# Patient Record
Sex: Female | Born: 1980 | Race: White | Hispanic: No | Marital: Married | Smoking: Former smoker
Health system: Southern US, Community
[De-identification: ages and names within clinical notes are randomized; demographics above are authoritative.]

## PROBLEM LIST (undated history)

## (undated) DIAGNOSIS — L309 Dermatitis, unspecified: Secondary | ICD-10-CM

## (undated) DIAGNOSIS — R7989 Other specified abnormal findings of blood chemistry: Secondary | ICD-10-CM

## (undated) DIAGNOSIS — F419 Anxiety disorder, unspecified: Secondary | ICD-10-CM

## (undated) DIAGNOSIS — F329 Major depressive disorder, single episode, unspecified: Secondary | ICD-10-CM

## (undated) DIAGNOSIS — T7840XA Allergy, unspecified, initial encounter: Secondary | ICD-10-CM

## (undated) DIAGNOSIS — F32A Depression, unspecified: Secondary | ICD-10-CM

## (undated) DIAGNOSIS — R609 Edema, unspecified: Secondary | ICD-10-CM

## (undated) DIAGNOSIS — Z5189 Encounter for other specified aftercare: Secondary | ICD-10-CM

## (undated) DIAGNOSIS — M1990 Unspecified osteoarthritis, unspecified site: Secondary | ICD-10-CM

## (undated) HISTORY — DX: Depression, unspecified: F32.A

## (undated) HISTORY — DX: Anxiety disorder, unspecified: F41.9

## (undated) HISTORY — DX: Edema, unspecified: R60.9

## (undated) HISTORY — DX: Allergy, unspecified, initial encounter: T78.40XA

## (undated) HISTORY — DX: Other specified abnormal findings of blood chemistry: R79.89

## (undated) HISTORY — PX: FOOT SURGERY: SHX648

## (undated) HISTORY — DX: Encounter for other specified aftercare: Z51.89

## (undated) HISTORY — DX: Unspecified osteoarthritis, unspecified site: M19.90

## (undated) HISTORY — DX: Dermatitis, unspecified: L30.9

## (undated) HISTORY — PX: CHOLECYSTECTOMY: SHX55

---
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
## 2005-07-31 ENCOUNTER — Emergency Department: Payer: Self-pay | Admitting: Emergency Medicine

## 2005-08-01 ENCOUNTER — Inpatient Hospital Stay: Payer: Self-pay | Admitting: General Surgery

## 2005-08-05 ENCOUNTER — Inpatient Hospital Stay: Payer: Self-pay | Admitting: General Surgery

## 2006-01-16 ENCOUNTER — Emergency Department: Payer: Self-pay | Admitting: Emergency Medicine

## 2006-07-09 ENCOUNTER — Emergency Department: Payer: Self-pay | Admitting: Emergency Medicine

## 2006-08-25 ENCOUNTER — Emergency Department: Payer: Self-pay | Admitting: Unknown Physician Specialty

## 2006-09-21 ENCOUNTER — Ambulatory Visit: Payer: Self-pay | Admitting: Emergency Medicine

## 2006-11-01 ENCOUNTER — Ambulatory Visit: Payer: Self-pay | Admitting: Internal Medicine

## 2006-11-27 ENCOUNTER — Emergency Department: Payer: Self-pay | Admitting: Emergency Medicine

## 2007-01-01 ENCOUNTER — Emergency Department: Payer: Self-pay | Admitting: Emergency Medicine

## 2007-01-01 ENCOUNTER — Other Ambulatory Visit: Payer: Self-pay

## 2007-05-06 ENCOUNTER — Emergency Department: Payer: Self-pay | Admitting: Emergency Medicine

## 2007-06-12 ENCOUNTER — Ambulatory Visit: Payer: Self-pay | Admitting: Family Medicine

## 2007-09-10 IMAGING — US ABDOMEN ULTRASOUND
1 series · 17 of 25 positions shown · non-contrast
Comparison: none

REASON FOR EXAM: kidney stones rm 4
COMMENTS:

[Series 1: abdomen ultrasound · 17 of 57 slices shown]
[im 1/57]
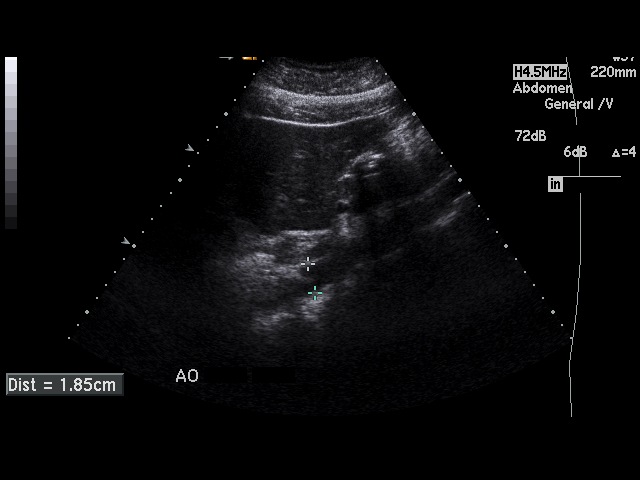
[im 5/57]
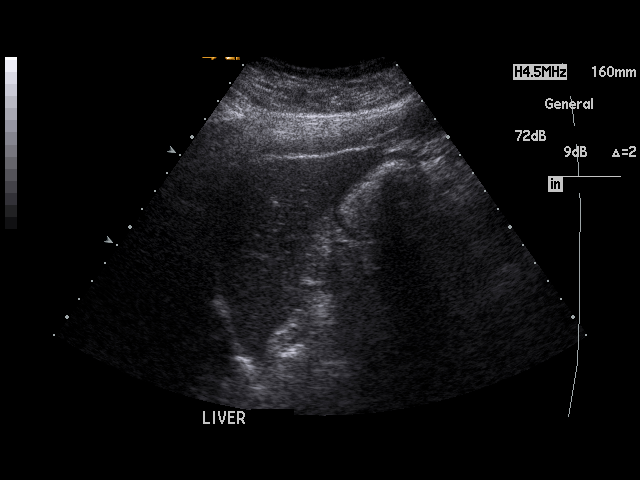
[im 8/57]
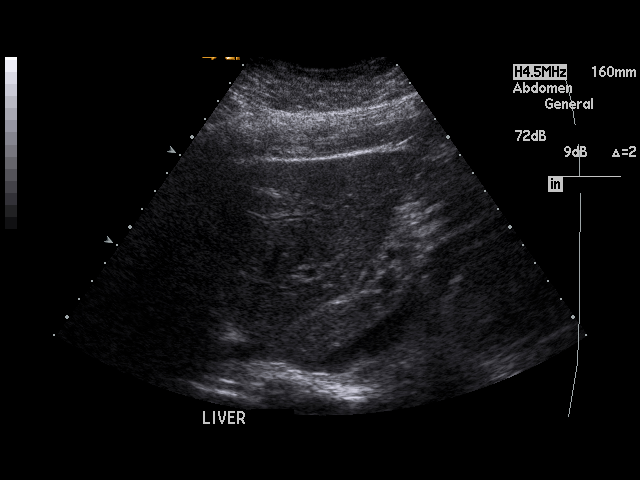
[im 12/57]
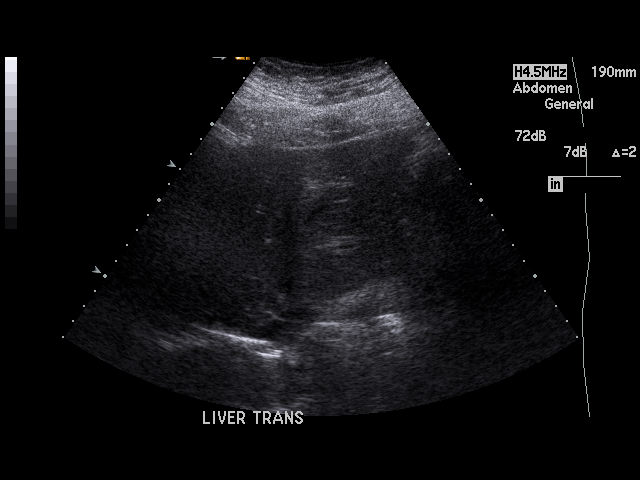
[im 15/57]
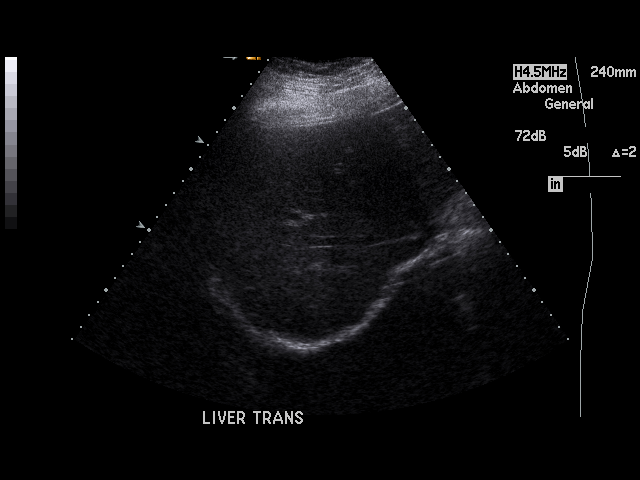
[im 19/57]
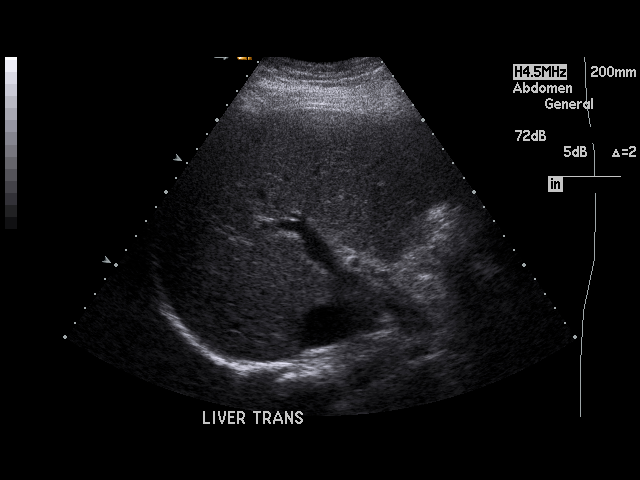
[im 22/57]
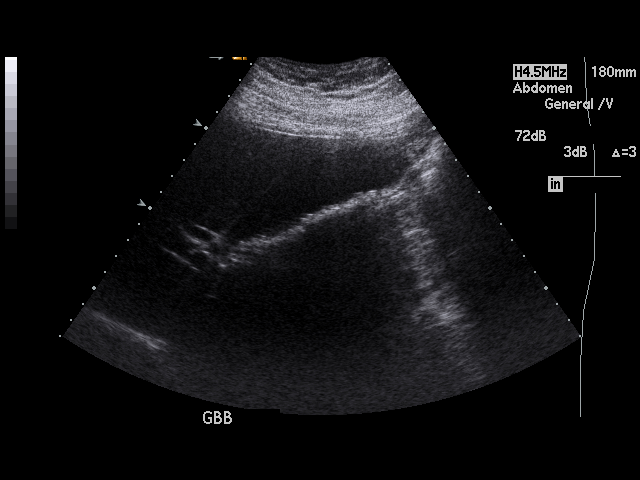
[im 26/57]
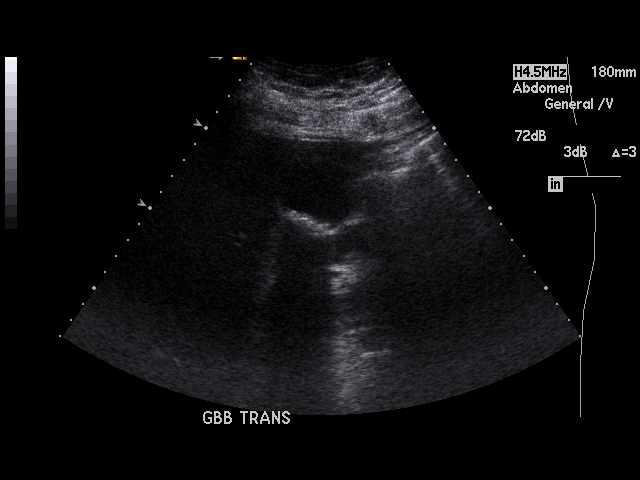
[im 29/57]
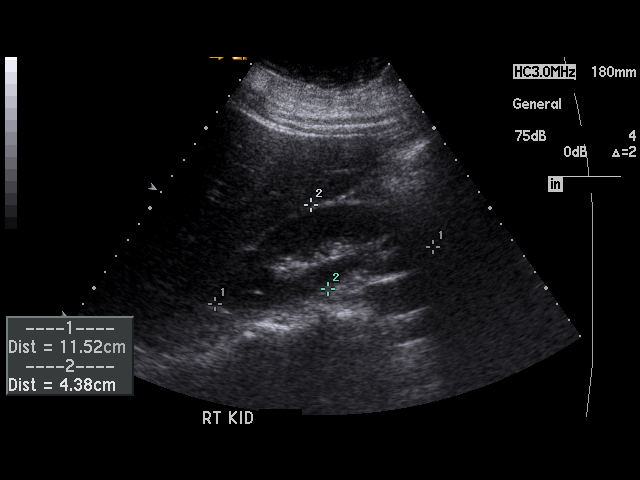
[im 31/57]
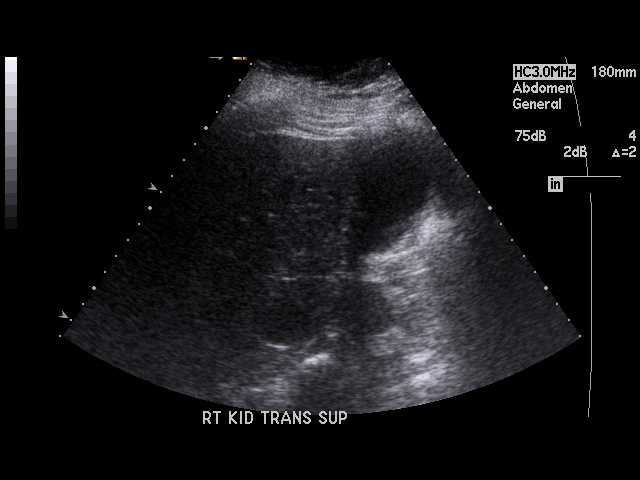
[im 36/57]
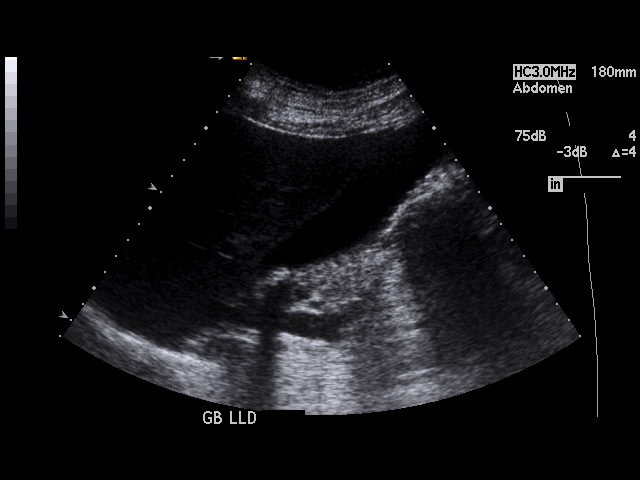
[im 38/57]
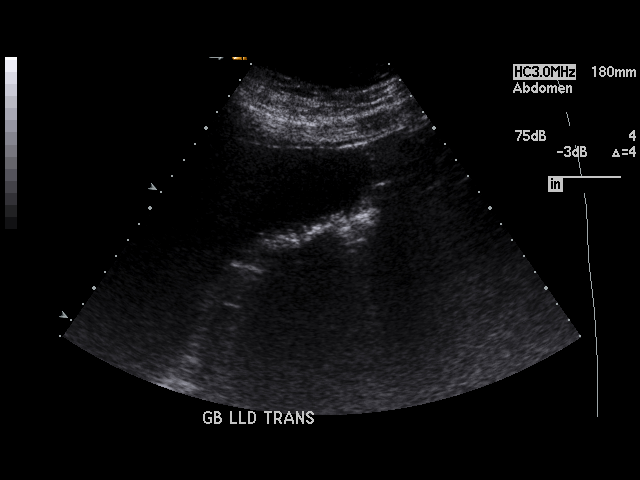
[im 43/57]
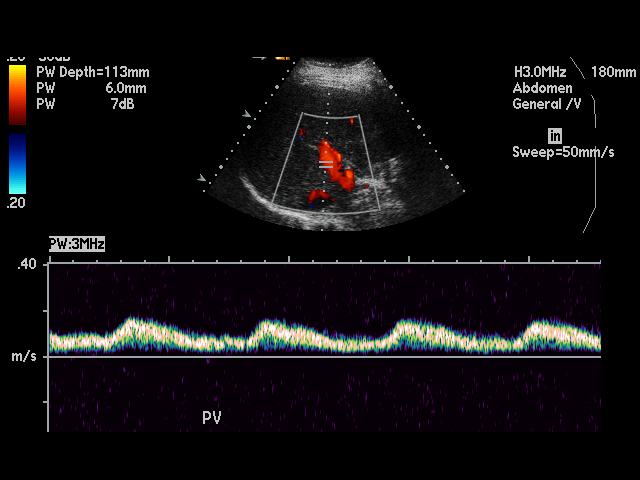
[im 45/57]
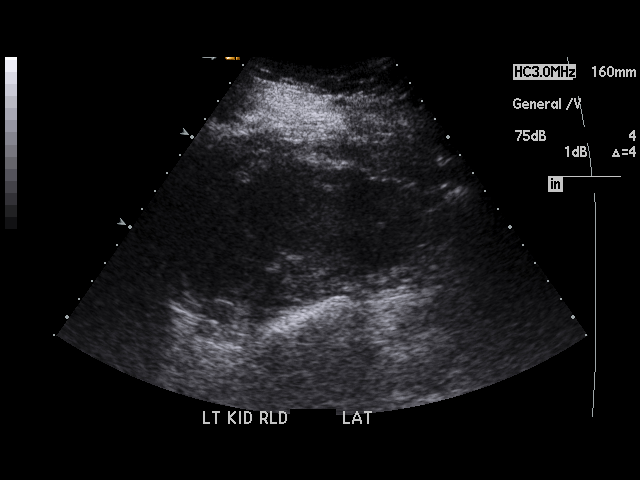
[im 50/57]
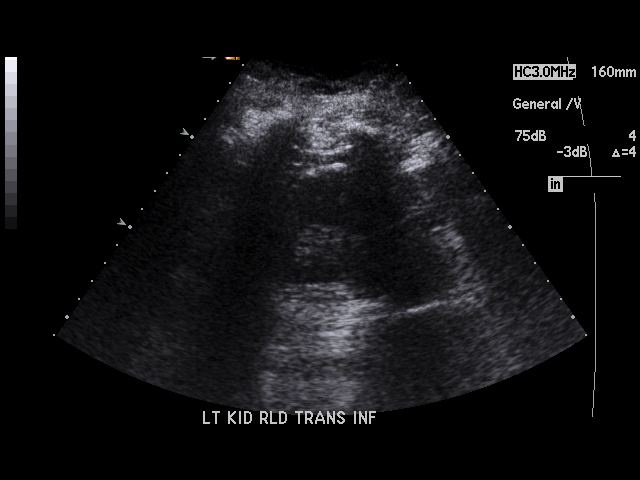
[im 52/57]
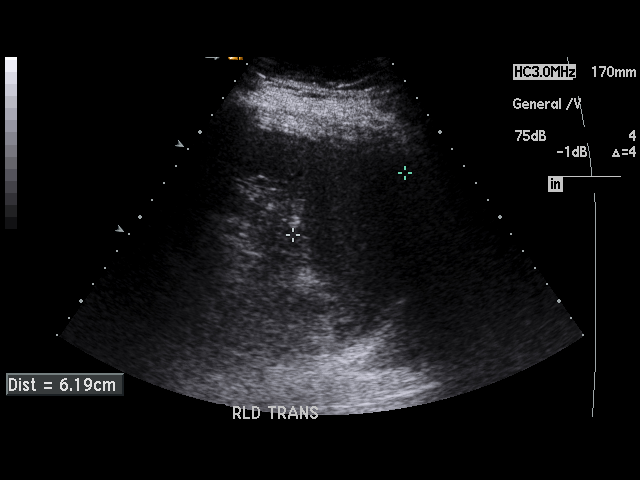
[im 57/57]
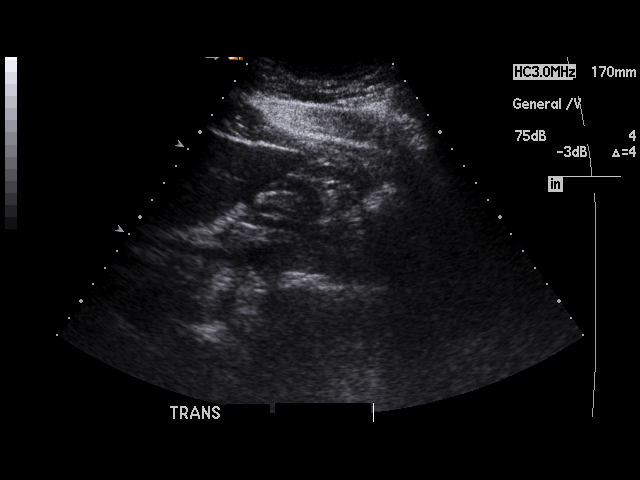

[17 of 25 positions shown; findings below may reference images not displayed]

PROCEDURE:     US  - US ABDOMEN GENERAL SURVEY  - August 01, 2005  [DATE]

RESULT:     The patient is complaining of abdominal discomfort and has a
history of urinary tract stones.

The kidneys demonstrate normal echotexture.  The RIGHT kidney measures
cm in length and the LEFT kidney measures 11.4 cm in length.  There is no
hydronephrosis.  I do not see objective evidence of calcified urinary tract
stones.

The liver exhibits normal echotexture with no evidence of mass or ductal
dilation. Normal portal venous flow toward the liver is seen. The
gallbladder is adequately distended and contains multiple echogenic layering
shadowing stones. There is no gallbladder wall thickening or pericholecystic
fluid.  There is no sonographic Murphy sign.  Some of the calcifications
appear to be non-mobile in the region of the gallbladder neck. The common
bile duct is normal at 4.9 mm in diameter.  The pancreas could not be
adequately evaluated due to bowel gas.  The spleen and abdominal aorta are
normal in appearance. There is no evidence of ascites.
IMPRESSION: 1)There are multiple tiny layering gallstones present.  I cannot exclude the
presence of some stones in the gallbladder neck, which are non-mobile.

2)I do not see evidence of urinary tract obstruction or of calcified kidney
stones.

3)I see no acute abnormality elsewhere within the abdomen.  The pancreas
could not be demonstrated.

A preliminary report was sent to the [HOSPITAL] the conclusion
of the study.

## 2007-09-13 ENCOUNTER — Emergency Department: Payer: Self-pay | Admitting: Emergency Medicine

## 2009-10-22 IMAGING — CR RIGHT ANKLE - COMPLETE 3+ VIEW
1 series · 5 of 5 positions shown · non-contrast
Comparison: none

REASON FOR EXAM: fall, injury, pain and swelling
COMMENTS:   LMP: depo

[Series 1: view not recorded · 0.17mm/px · 5 of 5 slices shown]
[im 1/5]
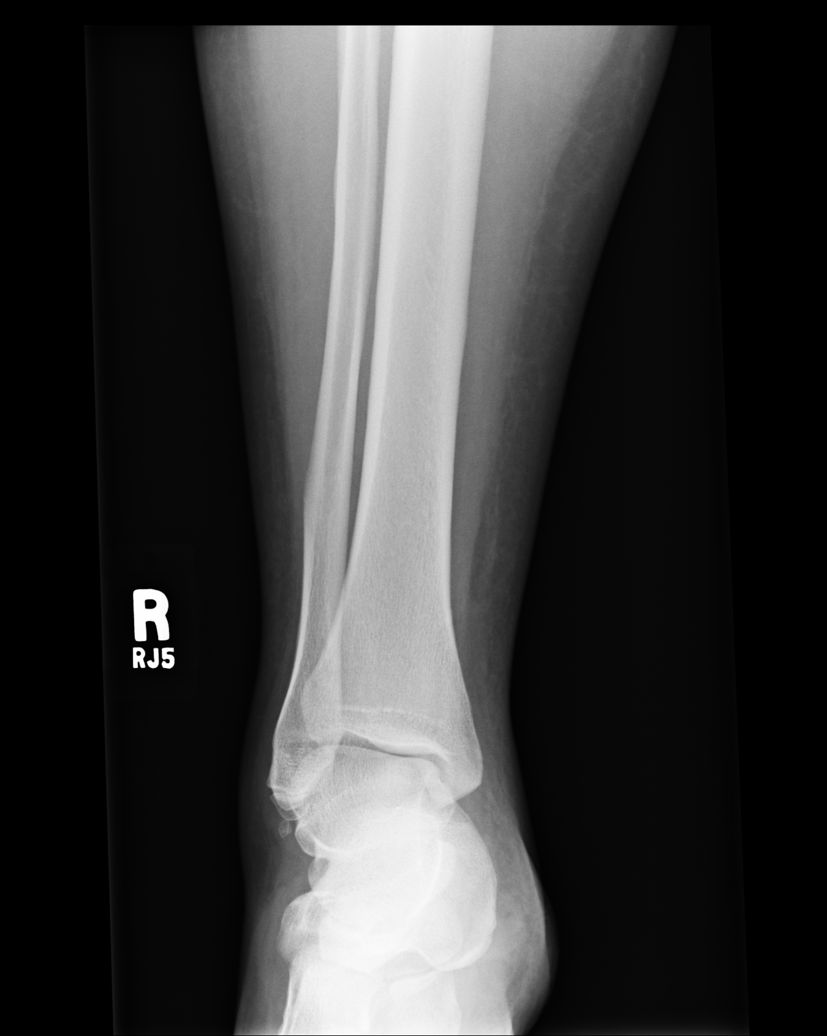
[im 2/5]
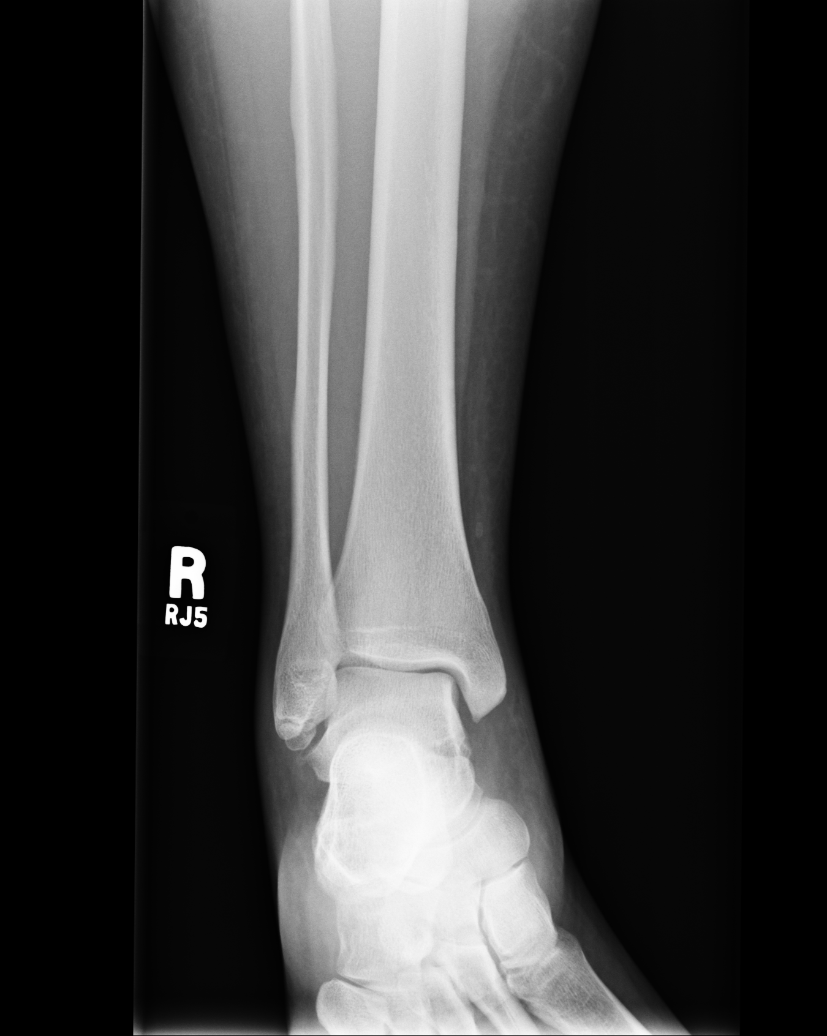
[im 3/5]
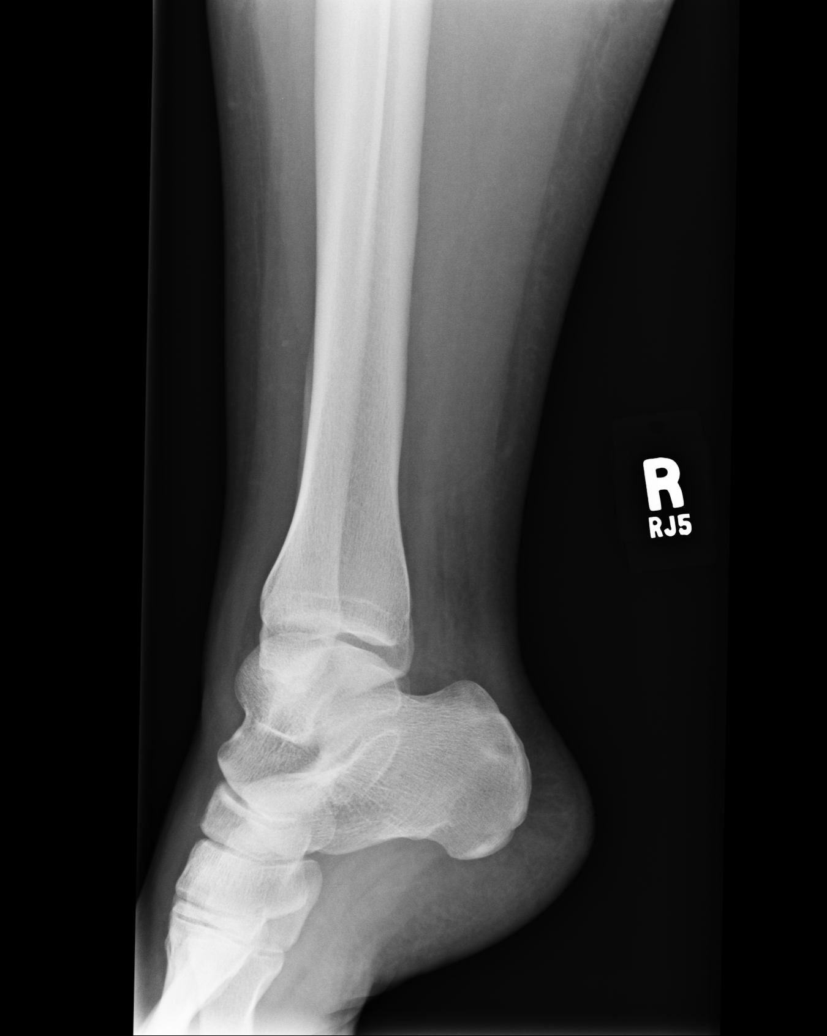
[im 4/5]
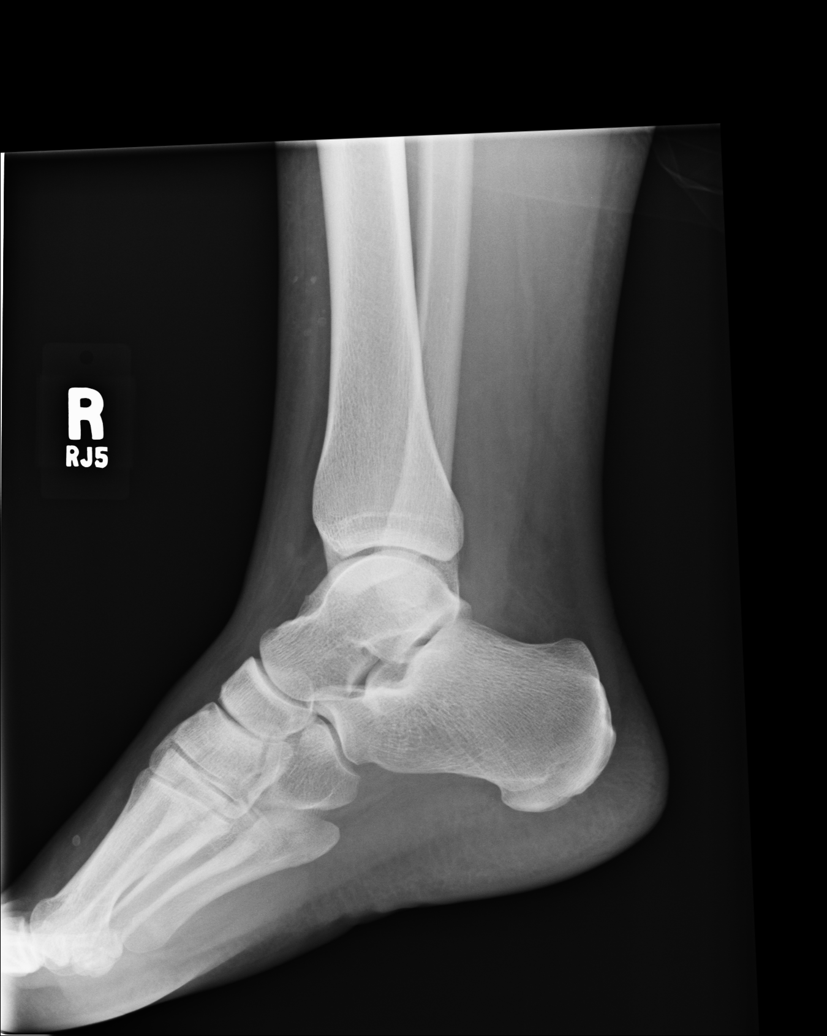
[im 5/5]
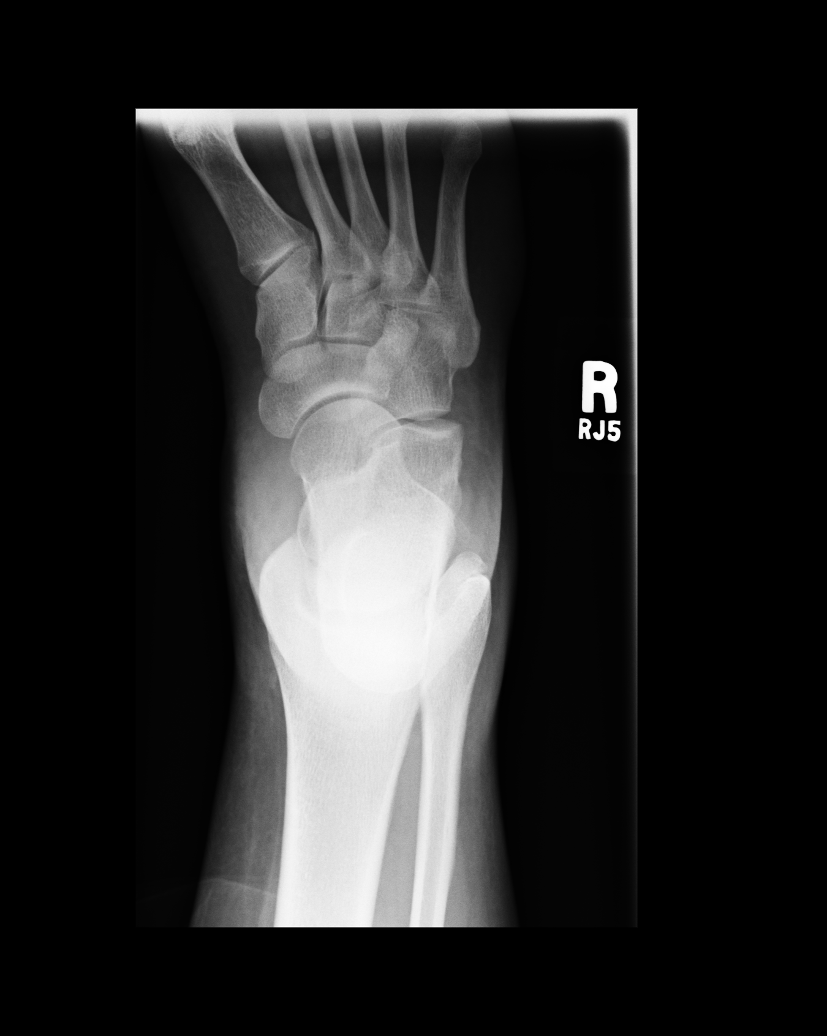

[5 of 5 positions shown; findings below may reference images not displayed]

PROCEDURE:     DXR - DXR ANKLE RIGHT COMPLETE  - September 13, 2007 [DATE]

RESULT:     The patient sustained a fracture through the distal fibula.
However, this does not appear entirely new. The margins of the fracture
fragments appear well corticated. I do not see more than mild soft tissue
swelling over the RIGHT ankle. The possibility of there being acute reinjury
of a previously fractured distal fibula is raised. The ankle joint mortise
is preserved. The talar dome is intact.
IMPRESSION: 1.     There is an abnormal appearance of the lateral malleolus. If there is
no history of prior fracture, then one must assume that there is an acute
fracture that is transversely oriented through the tip of the fibula. If
there is a history of prior fracture, the possibility of acute reinjury of a
previously fractured fibular tip is raised.
2.     I see no acute abnormality elsewhere involving the ankle.

## 2009-11-12 ENCOUNTER — Ambulatory Visit: Payer: Self-pay | Admitting: Internal Medicine

## 2009-11-27 ENCOUNTER — Ambulatory Visit: Payer: Self-pay | Admitting: Internal Medicine

## 2014-03-08 ENCOUNTER — Emergency Department: Payer: Self-pay | Admitting: Emergency Medicine

## 2016-02-09 ENCOUNTER — Encounter: Payer: Self-pay | Admitting: Emergency Medicine

## 2016-02-09 ENCOUNTER — Emergency Department
Admission: EM | Admit: 2016-02-09 | Discharge: 2016-02-09 | Disposition: A | Discharge status: Home / Self Care | Payer: Self-pay | Attending: Emergency Medicine | Admitting: Emergency Medicine

## 2016-02-09 DIAGNOSIS — S0501XA Injury of conjunctiva and corneal abrasion without foreign body, right eye, initial encounter: Secondary | ICD-10-CM | POA: Insufficient documentation

## 2016-02-09 DIAGNOSIS — X58XXXA Exposure to other specified factors, initial encounter: Secondary | ICD-10-CM | POA: Insufficient documentation

## 2016-02-09 DIAGNOSIS — Y939 Activity, unspecified: Secondary | ICD-10-CM | POA: Insufficient documentation

## 2016-02-09 DIAGNOSIS — Y929 Unspecified place or not applicable: Secondary | ICD-10-CM | POA: Insufficient documentation

## 2016-02-09 DIAGNOSIS — Y999 Unspecified external cause status: Secondary | ICD-10-CM | POA: Insufficient documentation

## 2016-02-09 DIAGNOSIS — H109 Unspecified conjunctivitis: Secondary | ICD-10-CM | POA: Insufficient documentation

## 2016-02-09 MED ORDER — TETRACAINE HCL 0.5 % OP SOLN
1.0000 [drp] | Freq: Once | OPHTHALMIC | Status: AC
  Administered 2016-02-09: 1 [drp] via OPHTHALMIC

## 2016-02-09 MED ORDER — FLUORESCEIN SODIUM 1 MG OP STRP
ORAL_STRIP | OPHTHALMIC | Status: AC
  Administered 2016-02-09: 1 via OPHTHALMIC
  Filled 2016-02-09: qty 1

## 2016-02-09 MED ORDER — FLUORESCEIN SODIUM 1 MG OP STRP
1.0000 | ORAL_STRIP | Freq: Once | OPHTHALMIC | Status: AC
  Administered 2016-02-09: 1 via OPHTHALMIC

## 2016-02-09 MED ORDER — ERYTHROMYCIN 5 MG/GM OP OINT
TOPICAL_OINTMENT | Freq: Once | OPHTHALMIC | Status: AC
  Administered 2016-02-09: 1 via OPHTHALMIC
  Filled 2016-02-09: qty 1

## 2016-02-09 MED ORDER — TETRACAINE HCL 0.5 % OP SOLN
OPHTHALMIC | Status: AC
  Administered 2016-02-09: 1 [drp] via OPHTHALMIC
  Filled 2016-02-09: qty 2

## 2016-02-09 MED ORDER — ERYTHROMYCIN 5 MG/GM OP OINT: TOPICAL_OINTMENT | Freq: Three times a day (TID) | OPHTHALMIC | 0 refills | Status: AC

## 2016-02-09 NOTE — ED Provider Notes (Signed)
[facility] Emergency Department Provider Note  Time seen: 7:25 AM  I have reviewed the triage vital signs and the nursing notes.   HISTORY  Chief Complaint Eye Problem    HPI Amy Larsen is a 35 y.o. female with no past medical history who presents the emergency department with a painful and red right eye. According to the patient she woke up this morning with drainage from her right eye, itching and tearing. States the eye hurts especially when she moves around. States it feels get dizzy when she scraped it several years ago. Denies any known trauma to the eye. Does not wear contacts. Denies any recent cough, congestion, fever, sinus congestion or nasal congestion.  No past medical history on file.  There are no active problems to display for this patient.   No past surgical history on file.  Prior to Admission medications   Not on File    Allergies  Allergen Reactions  . Codeine     No family history on file.  Social History Social History  Substance Use Topics  . Smoking status: Not on file  . Smokeless tobacco: Not on file  . Alcohol use Not on file    Review of Systems Constitutional: Negative for fever. Eyes:Right eye pain and redness, no visual changes ENT: Negative for congestion Cardiovascular: Negative for chest pain. Respiratory: Negative for shortness of breath. Gastrointestinal: Negative for abdominal pain Skin: Negative for rash. Neurological: Negative for headache 10-point ROS otherwise negative.  ____________________________________________   PHYSICAL EXAM:  VITAL SIGNS: ED Triage Vitals  Enc Vitals Group     BP 02/09/16 0630 135/83     Pulse Rate 02/09/16 0630 81     Resp 02/09/16 0630 18     Temp 02/09/16 0630 98.3 F (36.8 C)     Temp Source 02/09/16 0630 Oral     SpO2 02/09/16 0630 99 %     Weight 02/09/16 0629 (!) 345 lb (156.5 kg)     Height 02/09/16 0629 5\' 7"  (1.702 m)     Head Circumference --       Peak Flow --      Pain Score 02/09/16 0630 8     Pain Loc --      Pain Edu? --      Excl. in GC? --     Constitutional: Alert and oriented. Well appearing and in no distress. Eyes: Conjunctival injection of the right eye. PERRL, mild photophobia ENT   Head: Normocephalic and atraumatic   Mouth/Throat: Mucous membranes are moist. Cardiovascular: Normal rate, regular rhythm. No murmur Respiratory: Normal respiratory effort without tachypnea nor retractions. Breath sounds are clear Gastrointestinal: Soft and nontender. Musculoskeletal: Ambulates without issue Neurologic:  Normal speech and language.  Skin:  No rash Psychiatric: Mood and affect are normal.  ____________________________________________   INITIAL IMPRESSION / ASSESSMENT AND PLAN / ED COURSE  Pertinent labs & imaging results that were available during my care of the patient were reviewed by me and considered in my medical decision making (see chart for details).  The patient presents to the emergency department with right eye redness, tearing and discomfort. Woods lamp examination shows a corneal abrasion to the lower aspect of the right cornea. Patient does not use contacts. We will place the patient on erythromycin ointment and have her follow-up with a PCP. I discussed return precautions.  ____________________________________________   FINAL CLINICAL IMPRESSION(S) / ED DIAGNOSES  conjunctivitis    Minna AntisKevin Omie Ferger, MD 02/09/16 [phone]

## 2016-02-09 NOTE — ED Triage Notes (Signed)
Patient ambulatory to triage with steady gait, without difficulty or distress noted; pt reports right eye redness & pain since yesterday

## 2018-10-10 ENCOUNTER — Encounter: Payer: Self-pay | Admitting: Physician Assistant

## 2018-10-10 ENCOUNTER — Other Ambulatory Visit: Payer: Self-pay

## 2018-10-10 ENCOUNTER — Ambulatory Visit (INDEPENDENT_AMBULATORY_CARE_PROVIDER_SITE_OTHER): Payer: BC Managed Care – PPO | Admitting: Physician Assistant

## 2018-10-10 VITALS — BP 116/75 | HR 90 | Temp 97.8°F | Resp 16 | Ht 67.0 in | Wt 382.0 lb

## 2018-10-10 DIAGNOSIS — Z13220 Encounter for screening for lipoid disorders: Secondary | ICD-10-CM

## 2018-10-10 DIAGNOSIS — E559 Vitamin D deficiency, unspecified: Secondary | ICD-10-CM

## 2018-10-10 DIAGNOSIS — E039 Hypothyroidism, unspecified: Secondary | ICD-10-CM | POA: Diagnosis not present

## 2018-10-10 DIAGNOSIS — G8929 Other chronic pain: Secondary | ICD-10-CM

## 2018-10-10 DIAGNOSIS — Z114 Encounter for screening for human immunodeficiency virus [HIV]: Secondary | ICD-10-CM | POA: Diagnosis not present

## 2018-10-10 DIAGNOSIS — Z130 Encounter for screening for diseases of the blood and blood-forming organs and certain disorders involving the immune mechanism: Secondary | ICD-10-CM

## 2018-10-10 DIAGNOSIS — M545 Low back pain, unspecified: Secondary | ICD-10-CM

## 2018-10-10 DIAGNOSIS — R600 Localized edema: Secondary | ICD-10-CM

## 2018-10-10 MED ORDER — ERGOCALCIFEROL 1.25 MG (50000 UT) PO CAPS: 50000.0000 [IU] | ORAL_CAPSULE | ORAL | 2 refills | Status: AC

## 2018-10-10 MED ORDER — GABAPENTIN 300 MG PO CAPS: 300.0000 mg | ORAL_CAPSULE | Freq: Three times a day (TID) | ORAL | 0 refills | Status: DC

## 2018-10-10 NOTE — Progress Notes (Signed)
Patient: Amy Larsen, Female    DOB: 1980-07-03, 38 y.o.   MRN: [id number] Visit Date: 10/11/2018  Today's Provider: Trinna Post, PA-C   Chief Complaint  Patient presents with  . New Patient (Initial Visit)   Subjective:     Annual physical exam Amy Larsen is a 38 y.o. female who presents today for health maintenance and complete physical. She feels well. She reports exercising 3 days a week, walking 30 mintues. She reports she is sleeping well.  [place], [place]. Currently unemployed. Vapes. Alcohol - rarely, no unprescribed drugs. She has two children.   Morbid Obesity: Had recently lost weight and reported gaining it back during [place].   Wt Readings from Last 3 Encounters:  10/10/18 (!) 382 lb (173.3 kg)  02/09/16 (!) 345 lb (156.5 kg)    Edema in Lower legs: Takes Lasix 40 mg daily. does not take potassium. Increased banana intake.  Eczema on face and arms: hydrocortisone ointment for face, prescribed from previous doctor. Steroid cream for arms - triamcinelone.   Hypothyroidism: 88 mcg levothyroxine daily, has been taking this for several years.  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 Also loses feeling in thigh and gets burning sensation across top and back. Pain constant. No MRI, previously worked up in [place]. No history of EMG. Xrays of hips - showed nothing. No low back xray. Stretching, increasing exercise, rest. Thrown from horse twice a decade ago and was injured on the side in question. Elliptical helped somewhat. Regular walking can make it worse. Trouble standing for 20-30 minutes. Previously on piroxoicam. Medical grade ibuprofen. Tramadol from previous. Has taken tylenol previously and doesn't work. Pain constantly at least at a 5. Pain spikes with cold or heat.   Anxiety and Depression: Went to therapist and was previously on prozac. Support group of five different friends.  Doesn't need medication or counseling right now.   PAP Smear: She cannot remember when she last had a PAP smear.   Nexplanon: currently in place  -----------------------------------------------------------------   Review of Systems  Constitutional: Negative.   HENT: Negative.   Eyes: Negative.   Respiratory: Negative.   Cardiovascular: Positive for leg swelling.  Gastrointestinal: Negative.   Endocrine: Negative.   Genitourinary: Negative.   Musculoskeletal: Positive for back pain and myalgias.  Skin: Positive for rash.  Allergic/Immunologic: Positive for environmental allergies.  Neurological: Negative.   Hematological: Negative.   Psychiatric/Behavioral: Negative.     Social History      She  reports that she has quit smoking. Her smoking use included e-cigarettes. She has never used smokeless tobacco. She reports current alcohol use.       Social History   Socioeconomic History  . Marital status: Married    Spouse name: Not on file  . Number of children: Not on file  . Years of education: Not on file  . Highest education level: Not on file  Occupational History  . Not on file  Social Needs  . Financial resource strain: Not on file  . Food insecurity    Worry: Not on file    Inability: Not on file  . Transportation needs    Medical: Not on file    Non-medical: Not on file  Tobacco Use  . Smoking status: Former Smoker    Types: E-cigarettes  . Smokeless tobacco: Never Used  Substance and Sexual Activity  .  Alcohol use: Yes  . Drug use: Not on file  . Sexual activity: Not on file  Lifestyle  . Physical activity    Days per week: Not on file    Minutes per session: Not on file  . Stress: Not on file  Relationships  . Social Musicianconnections    Talks on phone: Not on file    Gets together: Not on file    Attends religious service: Not on file    Active member of club or organization: Not on file    Attends meetings of clubs or organizations: Not on file     Relationship status: Not on file  Other Topics Concern  . Not on file  Social History Narrative  . Not on file    Past Medical History:  Diagnosis Date  . Allergy   . Anxiety   . Blood transfusion without reported diagnosis   . Depression   . Eczema   . Edema   . Low vitamin D level      Patient Active Problem List   Diagnosis Date Noted  . Morbid obesity (HCC) 10/11/2018  . Hypothyroidism 10/11/2018  . Chronic bilateral low back pain 10/11/2018  . Lower extremity edema 10/11/2018    Past Surgical History:  Procedure Laterality Date  . CHOLECYSTECTOMY      Family History        Family Status  Relation Name Status  . Mother  Alive  . Father  Alive  . MGM  Deceased  . MGF  Deceased        Her family history includes Anxiety disorder in her mother; Congenital heart disease in her maternal grandfather; Depression in her mother; Hypertension in her maternal grandmother; Insomnia in her mother; Lung cancer in her maternal grandmother.      Allergies  Allergen Reactions  . Codeine      Current Outpatient Medications:  .  ergocalciferol (VITAMIN D2) 1.25 MG (50000 UT) capsule, Take 1 capsule (50,000 Units total) by mouth once a week., Disp: 4 capsule, Rfl: 2 .  furosemide (LASIX) 40 MG tablet, Take 40 mg by mouth., Disp: , Rfl:  .  levothyroxine (SYNTHROID) 88 MCG tablet, Take 88 mcg by mouth daily before breakfast., Disp: , Rfl:  .  traMADol (ULTRAM) 50 MG tablet, Take 50 mg by mouth every 12 (twelve) hours as needed., Disp: , Rfl:  .  gabapentin (NEURONTIN) 300 MG capsule, Take 1 capsule (300 mg total) by mouth 3 (three) times daily., Disp: 90 capsule, Rfl: 0   Patient Care Team: Maryella ShiversPollak, Adriana M, PA-C as PCP - General (Physician Assistant)    Objective:    Vitals: BP 116/75 (BP Location: Left Arm, Patient Position: Sitting, Cuff Size: Large)   Pulse 90   Temp 97.8 F (36.6 C) (Oral)   Resp 16   Ht 5\' 7"  (1.702 m)   Wt (!) 382 lb (173.3 kg)   BMI 59.83  kg/m    Vitals:   10/10/18 1127  BP: 116/75  Pulse: 90  Resp: 16  Temp: 97.8 F (36.6 C)  TempSrc: Oral  Weight: (!) 382 lb (173.3 kg)  Height: 5\' 7"  (1.702 m)     Physical Exam Constitutional:      Appearance: Normal appearance.  Neck:     Thyroid: No thyromegaly.  Cardiovascular:     Rate and Rhythm: Normal rate and regular rhythm.     Heart sounds: Normal heart sounds.  Pulmonary:     Effort: Pulmonary  effort is normal.     Breath sounds: Normal breath sounds.  Skin:    General: Skin is warm and dry.  Neurological:     Mental Status: She is alert and oriented to person, place, and time. Mental status is at baseline.  Psychiatric:        Mood and Affect: Mood normal.        Behavior: Behavior normal.      Depression Screen PHQ 2/9 Scores 10/10/2018  PHQ - 2 Score 2  PHQ- 9 Score 5       Assessment & Plan:     Routine Health Maintenance and Physical Exam  Exercise Activities and Dietary recommendations Goals   None      There is no immunization history on file for this patient.  Health Maintenance  Topic Date Due  . HIV Screening  11/11/1995  . TETANUS/TDAP  11/11/1999  . PAP SMEAR-Modifier  11/10/2001  . INFLUENZA VACCINE  11/17/2018     Discussed health benefits of physical activity, and encouraged her to engage in regular exercise appropriate for her age and condition.   1. Hypothyroidism, unspecified type  Check labs as below.   - TSH - Comprehensive Metabolic Panel (CMET)  2. Encounter for screening for HIV  - HIV antibody (with reflex)  3. Screening cholesterol level  - Lipid Profile  4. Screening for deficiency anemia  - CBC with Differential  5. Morbid obesity (HCC)  Check A1C today.  - HgB A1c  6. Chronic bilateral low back pain, unspecified whether sciatica present  Will request records from previous PCP. She has had low back pain for several years mostly on her left size. She is using a cane for ambulation.  Severe obesity is likely a component of this. She may have herniated disc. She has never had a lumbar spine xray of MRI. She had hip xrays from her previous PCP which were negative. She was on tramadol PRN. Has tried NSAIDs and tylenol. Has not tried physical therapy. Request she try at least 5 conservative interventions and explore workup with ortho. We will try gabapentin. May also consider cymbalta and effexor. Recommend physical therapy. Follow up in one month for back pain and PAP.  The entirety of the information documented in the History of Present Illness, Review of Systems and Physical Exam were personally obtained by me. Portions of this information were initially documented by Rondel BatonSulibeya Dimas, CMA and reviewed by me for thoroughness and accuracy.     --------------------------------------------------------------------    Trey SailorsAdriana M Pollak, PA-C  [facility]

## 2018-10-10 NOTE — Patient Instructions (Signed)
Hypothyroidism  Hypothyroidism is when the thyroid gland does not make enough of certain hormones (it is underactive). The thyroid gland is a small gland located in the lower front part of the neck, just in front of the windpipe (trachea). This gland makes hormones that help control how the body uses food for energy (metabolism) as well as how the heart and brain function. These hormones also play a role in keeping your bones strong. When the thyroid is underactive, it produces too little of the hormones thyroxine (T4) and triiodothyronine (T3). What are the causes? This condition may be caused by:  Hashimoto's disease. This is a disease in which the body's disease-fighting system (immune system) attacks the thyroid gland. This is the most common cause.  Viral infections.  Pregnancy.  Certain medicines.  Birth defects.  Past radiation treatments to the head or neck for cancer.  Past treatment with radioactive iodine.  Past exposure to radiation in the environment.  Past surgical removal of part or all of the thyroid.  Problems with a gland in the center of the brain (pituitary gland).  Lack of enough iodine in the diet. What increases the risk? You are more likely to develop this condition if:  You are female.  You have a family history of thyroid conditions.  You use a medicine called lithium.  You take medicines that affect the immune system (immunosuppressants). What are the signs or symptoms? Symptoms of this condition include:  Feeling as though you have no energy (lethargy).  Not being able to tolerate cold.  Weight gain that is not explained by a change in diet or exercise habits.  Lack of appetite.  Dry skin.  Coarse hair.  Menstrual irregularity.  Slowing of thought processes.  Constipation.  Sadness or depression. How is this diagnosed? This condition may be diagnosed based on:  Your symptoms, your medical history, and a physical exam.  Blood  tests. You may also have imaging tests, such as an ultrasound or MRI. How is this treated? This condition is treated with medicine that replaces the thyroid hormones that your body does not make. After you begin treatment, it may take several weeks for symptoms to go away. Follow these instructions at home:  Take over-the-counter and prescription medicines only as told by your health care provider.  If you start taking any new medicines, tell your health care provider.  Keep all follow-up visits as told by your health care provider. This is important. ? As your condition improves, your dosage of thyroid hormone medicine may change. ? You will need to have blood tests regularly so that your health care provider can monitor your condition. Contact a health care provider if:  Your symptoms do not get better with treatment.  You are taking thyroid replacement medicine and you: ? Sweat a lot. ? Have tremors. ? Feel anxious. ? Lose weight rapidly. ? Cannot tolerate heat. ? Have emotional swings. ? Have diarrhea. ? Feel weak. Get help right away if you have:  Chest pain.  An irregular heartbeat.  A rapid heartbeat.  Difficulty breathing. Summary  Hypothyroidism is when the thyroid gland does not make enough of certain hormones (it is underactive).  When the thyroid is underactive, it produces too little of the hormones thyroxine (T4) and triiodothyronine (T3).  The most common cause is Hashimoto's disease, a disease in which the body's disease-fighting system (immune system) attacks the thyroid gland. The condition can also be caused by viral infections, medicine, pregnancy, or past   radiation treatment to the head or neck.  Symptoms may include weight gain, dry skin, constipation, feeling as though you do not have energy, and not being able to tolerate cold.  This condition is treated with medicine to replace the thyroid hormones that your body does not make. This information  is not intended to replace advice given to you by your health care provider. Make sure you discuss any questions you have with your health care provider. Document Released: 04/04/2005 Document Revised: 03/15/2017 Document Reviewed: 03/15/2017 Elsevier Interactive Patient Education  2019 [employer]  

## 2018-10-11 ENCOUNTER — Telehealth: Payer: Self-pay

## 2018-10-11 DIAGNOSIS — G8929 Other chronic pain: Secondary | ICD-10-CM | POA: Insufficient documentation

## 2018-10-11 DIAGNOSIS — E039 Hypothyroidism, unspecified: Secondary | ICD-10-CM | POA: Insufficient documentation

## 2018-10-11 DIAGNOSIS — R600 Localized edema: Secondary | ICD-10-CM | POA: Insufficient documentation

## 2018-10-11 LAB — CBC WITH DIFFERENTIAL/PLATELET
Basophils Absolute: 0.1 10*3/uL (ref 0.0–0.2)
Basos: 1 %
EOS (ABSOLUTE): 0.1 10*3/uL (ref 0.0–0.4)
Eos: 1 %
Hematocrit: 41 % (ref 34.0–46.6)
Hemoglobin: 13.4 g/dL (ref 11.1–15.9)
Immature Grans (Abs): 0.1 10*3/uL (ref 0.0–0.1)
Immature Granulocytes: 1 %
Lymphocytes Absolute: 2.3 10*3/uL (ref 0.7–3.1)
Lymphs: 22 %
MCH: 26.1 pg — ABNORMAL LOW (ref 26.6–33.0)
MCHC: 32.7 g/dL (ref 31.5–35.7)
MCV: 80 fL (ref 79–97)
Monocytes Absolute: 0.9 10*3/uL (ref 0.1–0.9)
Monocytes: 9 %
Neutrophils Absolute: 7.2 10*3/uL — ABNORMAL HIGH (ref 1.4–7.0)
Neutrophils: 66 %
Platelets: 412 10*3/uL (ref 150–450)
RBC: 5.13 x10E6/uL (ref 3.77–5.28)
RDW: 15.2 % (ref 11.7–15.4)
WBC: 10.6 10*3/uL (ref 3.4–10.8)

## 2018-10-11 LAB — TSH: TSH: 2.52 u[IU]/mL (ref 0.450–4.500)

## 2018-10-11 LAB — COMPREHENSIVE METABOLIC PANEL
ALT: 28 IU/L (ref 0–32)
AST: 19 IU/L (ref 0–40)
Albumin/Globulin Ratio: 1.4 (ref 1.2–2.2)
Albumin: 4 g/dL (ref 3.8–4.8)
Alkaline Phosphatase: 126 IU/L — ABNORMAL HIGH (ref 39–117)
BUN/Creatinine Ratio: 21 (ref 9–23)
BUN: 17 mg/dL (ref 6–20)
Bilirubin Total: 0.4 mg/dL (ref 0.0–1.2)
CO2: 21 mmol/L (ref 20–29)
Calcium: 9.2 mg/dL (ref 8.7–10.2)
Chloride: 106 mmol/L (ref 96–106)
Creatinine, Ser: 0.82 mg/dL (ref 0.57–1.00)
GFR calc Af Amer: 106 mL/min/{1.73_m2} (ref >59)
GFR calc non Af Amer: 92 mL/min/{1.73_m2} (ref >59)
Globulin, Total: 2.8 g/dL (ref 1.5–4.5)
Glucose: 86 mg/dL (ref 65–99)
Potassium: 4.5 mmol/L (ref 3.5–5.2)
Sodium: 141 mmol/L (ref 134–144)
Total Protein: 6.8 g/dL (ref 6.0–8.5)

## 2018-10-11 LAB — HEMOGLOBIN A1C
Est. average glucose Bld gHb Est-mCnc: 117 mg/dL
Hgb A1c MFr Bld: 5.7 % — ABNORMAL HIGH (ref 4.8–5.6)

## 2018-10-11 LAB — LIPID PANEL
Chol/HDL Ratio: 3.3 ratio (ref 0.0–4.4)
Cholesterol, Total: 162 mg/dL (ref 100–199)
HDL: 49 mg/dL (ref >39)
LDL Calculated: 100 mg/dL — ABNORMAL HIGH (ref 0–99)
Triglycerides: 63 mg/dL (ref 0–149)
VLDL Cholesterol Cal: 13 mg/dL (ref 5–40)

## 2018-10-11 LAB — HIV ANTIBODY (ROUTINE TESTING W REFLEX): HIV Screen 4th Generation wRfx: NONREACTIVE

## 2018-10-11 NOTE — Telephone Encounter (Signed)
Patient advised as below.  

## 2018-10-11 NOTE — Telephone Encounter (Signed)
-----   Message from Trinna Post, [place] sent at 10/11/2018  8:17 AM EDT ----- Her labwork shoes prediabetes. This would be treated by increasing weight loss and exercise. Other labwork normal.

## 2018-11-07 DIAGNOSIS — M1612 Unilateral primary osteoarthritis, left hip: Secondary | ICD-10-CM | POA: Diagnosis not present

## 2018-11-07 DIAGNOSIS — M25552 Pain in left hip: Secondary | ICD-10-CM | POA: Diagnosis not present

## 2018-11-07 DIAGNOSIS — M5416 Radiculopathy, lumbar region: Secondary | ICD-10-CM | POA: Diagnosis not present

## 2018-11-07 DIAGNOSIS — G8929 Other chronic pain: Secondary | ICD-10-CM | POA: Diagnosis not present

## 2018-11-07 DIAGNOSIS — M5442 Lumbago with sciatica, left side: Secondary | ICD-10-CM | POA: Diagnosis not present

## 2018-11-07 DIAGNOSIS — M5136 Other intervertebral disc degeneration, lumbar region: Secondary | ICD-10-CM | POA: Diagnosis not present

## 2018-11-07 DIAGNOSIS — M545 Low back pain: Secondary | ICD-10-CM | POA: Diagnosis not present

## 2018-11-09 DIAGNOSIS — M25552 Pain in left hip: Secondary | ICD-10-CM | POA: Diagnosis not present

## 2018-11-20 DIAGNOSIS — R262 Difficulty in walking, not elsewhere classified: Secondary | ICD-10-CM | POA: Diagnosis not present

## 2018-11-20 DIAGNOSIS — M25552 Pain in left hip: Secondary | ICD-10-CM | POA: Diagnosis not present

## 2018-11-20 DIAGNOSIS — M6281 Muscle weakness (generalized): Secondary | ICD-10-CM | POA: Diagnosis not present

## 2018-11-20 DIAGNOSIS — M545 Low back pain: Secondary | ICD-10-CM | POA: Diagnosis not present

## 2018-11-22 ENCOUNTER — Encounter: Payer: Self-pay | Admitting: Physician Assistant

## 2019-02-21 ENCOUNTER — Encounter: Payer: Self-pay | Admitting: Nurse Practitioner

## 2019-02-21 ENCOUNTER — Ambulatory Visit (INDEPENDENT_AMBULATORY_CARE_PROVIDER_SITE_OTHER): Payer: Self-pay | Admitting: Nurse Practitioner

## 2019-02-21 ENCOUNTER — Other Ambulatory Visit: Payer: Self-pay

## 2019-02-21 DIAGNOSIS — F4310 Post-traumatic stress disorder, unspecified: Secondary | ICD-10-CM

## 2019-02-21 DIAGNOSIS — G8929 Other chronic pain: Secondary | ICD-10-CM

## 2019-02-21 DIAGNOSIS — E039 Hypothyroidism, unspecified: Secondary | ICD-10-CM

## 2019-02-21 DIAGNOSIS — E559 Vitamin D deficiency, unspecified: Secondary | ICD-10-CM

## 2019-02-21 DIAGNOSIS — G894 Chronic pain syndrome: Secondary | ICD-10-CM

## 2019-02-21 DIAGNOSIS — R600 Localized edema: Secondary | ICD-10-CM

## 2019-02-21 DIAGNOSIS — M5442 Lumbago with sciatica, left side: Secondary | ICD-10-CM

## 2019-02-21 MED ORDER — LEVOTHYROXINE SODIUM 88 MCG PO TABS: 88.0000 ug | ORAL_TABLET | Freq: Every day | ORAL | 3 refills | Status: DC

## 2019-02-21 NOTE — Assessment & Plan Note (Signed)
Recommend continued focus on health diet choices and regular physical activity (30 minutes 5 days a week).  Would benefit from pain clinic recommendations and treatment, which feel will assist in exercise regimen once pain controlled.

## 2019-02-21 NOTE — Assessment & Plan Note (Addendum)
Chronic, ongoing with recent TSH in normal range.  Will send in refills.  Plan to follow-up in office in 4 weeks and obtain thyroid panel.  Adjust dose as needed.

## 2019-02-21 NOTE — Patient Instructions (Signed)

## 2019-02-21 NOTE — Assessment & Plan Note (Signed)
Ongoing for years, suspect some lymphedema due to obesity.  Continue Lasix.  Discussed with patient, may benefit from massage therapy for edema.  She wishes to research this.  Continue TED hose and DASH diet.

## 2019-02-21 NOTE — Assessment & Plan Note (Addendum)
Referral placed to pain clinic at [facility], would benefit from their program.

## 2019-02-21 NOTE — Assessment & Plan Note (Signed)
Ongoing.  Continue daily supplement and will recheck Vit D level at face to face visit.  Consider weekly dosing if needed.

## 2019-02-21 NOTE — Assessment & Plan Note (Signed)
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  She denies need for medication at this time as had lengthy therapy time in past and utilizes tools taught.  Discussed option of Trazodone for sleep or Duloxetine which would benefit both mood and chronic pain.  She wishes to research these further.  Adjust plan of care as needed. 

## 2019-02-21 NOTE — Assessment & Plan Note (Signed)
Chronic, ongoing.  Discussed with her various options, such as Duloxetine which would benefit mood and pain.  Continue simple treatment at home.  Is aware Tramadol will not be filled today due to initial visit and recommend pain clinic referral for further evaluation and recommendations for pain.  She agrees with this referral and is interested.  Referral placed today.
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
## 2019-03-28 ENCOUNTER — Other Ambulatory Visit: Payer: Self-pay

## 2019-03-29 ENCOUNTER — Encounter: Payer: Self-pay | Admitting: Nurse Practitioner

## 2019-03-29 ENCOUNTER — Ambulatory Visit (INDEPENDENT_AMBULATORY_CARE_PROVIDER_SITE_OTHER): Payer: Self-pay | Admitting: Nurse Practitioner

## 2019-03-29 ENCOUNTER — Other Ambulatory Visit: Payer: Self-pay

## 2019-03-29 DIAGNOSIS — R600 Localized edema: Secondary | ICD-10-CM

## 2019-03-29 DIAGNOSIS — E559 Vitamin D deficiency, unspecified: Secondary | ICD-10-CM

## 2019-03-29 DIAGNOSIS — E039 Hypothyroidism, unspecified: Secondary | ICD-10-CM

## 2019-03-29 DIAGNOSIS — R7301 Impaired fasting glucose: Secondary | ICD-10-CM | POA: Insufficient documentation

## 2019-03-29 DIAGNOSIS — F4310 Post-traumatic stress disorder, unspecified: Secondary | ICD-10-CM

## 2019-03-29 DIAGNOSIS — G8929 Other chronic pain: Secondary | ICD-10-CM

## 2019-03-29 DIAGNOSIS — M5442 Lumbago with sciatica, left side: Secondary | ICD-10-CM

## 2019-03-29 DIAGNOSIS — R7303 Prediabetes: Secondary | ICD-10-CM

## 2019-03-29 MED ORDER — CLINDAMYCIN PHOS-BENZOYL PEROX 1-5 % EX GEL: Freq: Two times a day (BID) | CUTANEOUS | 0 refills | Status: DC

## 2019-03-29 NOTE — Assessment & Plan Note (Signed)
Recheck Vit D level and adjust regimen as needed,

## 2019-03-29 NOTE — Assessment & Plan Note (Signed)
Chronic, ongoing.  On review pain clinic referral from previous visit has been authorized and [facility] was to reach out to patient, will check on this with referral team + provided [facility] number to patient to call and schedule visit.  Would benefit from pain evaluation and recommendations.  At this time continue at home regimen, including CBD.

## 2019-03-29 NOTE — Assessment & Plan Note (Signed)
Recheck A1C today and adjust regimen as needed. 

## 2019-03-29 NOTE — Assessment & Plan Note (Signed)
Recommend focus on healthy diet choices and regular physical activity (30 minutes 5 days a week).  Once able to assist with chronic pain will focus more on weight loss with patient. 

## 2019-03-29 NOTE — Assessment & Plan Note (Signed)
Chronic, ongoing with recent TSH in normal range.  Recheck thyroid panel today and adjust medication as needed.   

## 2019-03-29 NOTE — Assessment & Plan Note (Signed)
Ongoing for years, suspect some lymphedema due to obesity.  Continue Lasix.  Discussed with patient, may benefit from massage therapy for edema.  Continue TED hose and DASH diet at home.
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
## 2019-03-29 NOTE — Patient Instructions (Signed)

## 2019-03-29 NOTE — Assessment & Plan Note (Signed)
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  She denies need for medication at this time as had lengthy therapy time in past and utilizes tools taught.  Discussed option of Trazodone for sleep or Duloxetine which would benefit both mood and chronic pain.  She wishes to research these further.  Adjust plan of care as needed. 

## 2019-03-30 LAB — THYROID PANEL WITH TSH
Free Thyroxine Index: 3.2 (ref 1.2–4.9)
T3 Uptake Ratio: 29 % (ref 24–39)
T4, Total: 11 ug/dL (ref 4.5–12.0)
TSH: 1.78 u[IU]/mL (ref 0.450–4.500)

## 2019-03-30 LAB — COMPREHENSIVE METABOLIC PANEL
ALT: 25 IU/L (ref 0–32)
AST: 21 IU/L (ref 0–40)
Albumin/Globulin Ratio: 1.4 (ref 1.2–2.2)
Albumin: 3.6 g/dL — ABNORMAL LOW (ref 3.8–4.8)
Alkaline Phosphatase: 132 IU/L — ABNORMAL HIGH (ref 39–117)
BUN/Creatinine Ratio: 15 (ref 9–23)
BUN: 14 mg/dL (ref 6–20)
Bilirubin Total: 0.4 mg/dL (ref 0.0–1.2)
CO2: 25 mmol/L (ref 20–29)
Calcium: 9.3 mg/dL (ref 8.7–10.2)
Chloride: 103 mmol/L (ref 96–106)
Creatinine, Ser: 0.91 mg/dL (ref 0.57–1.00)
GFR calc Af Amer: 93 mL/min/{1.73_m2} (ref >59)
GFR calc non Af Amer: 80 mL/min/{1.73_m2} (ref >59)
Globulin, Total: 2.6 g/dL (ref 1.5–4.5)
Glucose: 93 mg/dL (ref 65–99)
Potassium: 4.1 mmol/L (ref 3.5–5.2)
Sodium: 141 mmol/L (ref 134–144)
Total Protein: 6.2 g/dL (ref 6.0–8.5)

## 2019-03-30 LAB — HEMOGLOBIN A1C
Est. average glucose Bld gHb Est-mCnc: 117 mg/dL
Hgb A1c MFr Bld: 5.7 % — ABNORMAL HIGH (ref 4.8–5.6)

## 2019-03-30 LAB — VITAMIN D 25 HYDROXY (VIT D DEFICIENCY, FRACTURES): Vit D, 25-Hydroxy: 33.5 ng/mL (ref 30.0–100.0)

## 2019-04-26 ENCOUNTER — Other Ambulatory Visit: Payer: Self-pay

## 2019-04-26 ENCOUNTER — Ambulatory Visit (INDEPENDENT_AMBULATORY_CARE_PROVIDER_SITE_OTHER): Payer: Self-pay | Admitting: Nurse Practitioner

## 2019-04-26 ENCOUNTER — Encounter: Payer: Self-pay | Admitting: Nurse Practitioner

## 2019-04-26 DIAGNOSIS — L700 Acne vulgaris: Secondary | ICD-10-CM

## 2019-04-26 DIAGNOSIS — G4700 Insomnia, unspecified: Secondary | ICD-10-CM | POA: Insufficient documentation

## 2019-04-26 DIAGNOSIS — F5104 Psychophysiologic insomnia: Secondary | ICD-10-CM

## 2019-04-26 DIAGNOSIS — L709 Acne, unspecified: Secondary | ICD-10-CM | POA: Insufficient documentation

## 2019-04-26 NOTE — Assessment & Plan Note (Signed)
Chronic, ongoing.  Continue CBD oil at home.  If worsening could consider Trazodone, which would benefit both mood and sleep pattern.  Return in 6 months or sooner if worsening.   

## 2019-04-26 NOTE — Assessment & Plan Note (Signed)
Improving with Clind/Benzo gel.  Continue this treatment and adjust as needed.  Will consider referral to dermatology if worsening.

## 2019-04-26 NOTE — Patient Instructions (Signed)

## 2019-04-26 NOTE — Assessment & Plan Note (Signed)
Recommend focus on healthy diet choices and regular physical activity (30 minutes 5 days a week).  Once able to assist with chronic pain will focus more on weight loss with patient.

## 2019-04-26 NOTE — Progress Notes (Signed)
There were no vitals taken for this visit.   Subjective:    Patient ID: Amy Larsen, female    DOB: 10-09-1980, 39 y.o.   MRN: [id number]  HPI: Amy Larsen is a 39 y.o. female  Chief Complaint  Patient presents with  . Skin Problem    4 week f/up    . This visit was completed via [employer] due to the restrictions of the COVID-19 pandemic. All issues as above were discussed and addressed. Physical exam was done as above through visual confirmation on [employer]ity. If it was felt that the patient should be evaluated in the office, they were directed there. The patient verbally consented to this visit. . Location of the patient: home . Location of the provider: home . Those involved with this call:  . Provider: Marnee Guarneri, DNP . CMA: Yvonna Alanis, CMA . Front Desk/Registration: Don Perking  . Time spent on call: 15 minutes on the phone discussing health concerns. 10 minutes total spent in review of patient's record and preparation of their chart.  . I verified patient identity using two factors (patient name and date of birth). Patient consents verbally to being seen via telemedicine visit today.   ACNE Started on Clinda-benzo gel last visit for acne.  Has Nexplanon in place, has had issues with eczema and acne on/off.  Reports Clinda/Benzo gel has helped clear up many areas. Duration: chronic Current face care: OTC acne preparations Current acne medications: benzoyl peroxide Previous acne medications: benzoyl peroxide Acne problem areas:face and cheeks  Worst area:cheeks  Picking/popping habits: no Previous dermatology evaluation: no Aggravating factors: unknown Acne status: exacerbated  INSOMNIA Currently continues to use CBD oil, which she reports is beneficial.  Insomnia was exacerbated recently by her mother calling her, she endorses her mother is a stressor for her. Duration: chronic Satisfied with sleep quality: yes Difficulty falling asleep:  yes Difficulty staying asleep: yes Waking a few hours after sleep onset: at times Early morning awakenings: no Daytime hypersomnolence: no Wakes feeling refreshed: yes Good sleep hygiene: yes Apnea: no Snoring: no Depressed/anxious mood: yes Recent stress: yes Restless legs/nocturnal leg cramps: no Chronic pain/arthritis: no History of sleep study: no Treatments attempted: melatonin and CBD oil   Relevant past medical, surgical, family and social history reviewed and updated as indicated. Interim medical history since our last visit reviewed. Allergies and medications reviewed and updated.  Review of Systems  Constitutional: Negative for activity change, appetite change, diaphoresis, fatigue and fever.  Respiratory: Negative for cough, chest tightness and shortness of breath.   Cardiovascular: Negative for chest pain, palpitations and leg swelling.  Gastrointestinal: Negative.   Psychiatric/Behavioral: Positive for sleep disturbance. Negative for decreased concentration, self-injury and suicidal ideas. The patient is nervous/anxious.     Per HPI unless specifically indicated above     Objective:    There were no vitals taken for this visit.  Wt Readings from Last 3 Encounters:  10/10/18 (!) 382 lb (173.3 kg)  02/09/16 (!) 345 lb (156.5 kg)    Physical Exam Vitals and nursing note reviewed.  Constitutional:      General: She is awake. She is not in acute distress.    Appearance: She is well-developed. She is morbidly obese. She is not ill-appearing.  HENT:     Head: Normocephalic.     Right Ear: Hearing normal.     Left Ear: Hearing normal.  Eyes:     General: Lids are normal.  Right eye: No discharge.        Left eye: No discharge.     Conjunctiva/sclera: Conjunctivae normal.  Pulmonary:     Effort: Pulmonary effort is normal. No accessory muscle usage or respiratory distress.  Musculoskeletal:     Cervical back: Normal range of motion.  Neurological:      Mental Status: She is alert and oriented to person, place, and time.  Psychiatric:        Attention and Perception: Attention normal.        Mood and Affect: Mood normal.        Behavior: Behavior normal. Behavior is cooperative.        Thought Content: Thought content normal.        Judgment: Judgment normal.     Results for orders placed or performed in visit on 03/29/19  Thyroid Panel With TSH  Result Value Ref Range   TSH 1.780 0.450 - 4.500 uIU/mL   T4, Total 11.0 4.5 - 12.0 ug/dL   T3 Uptake Ratio 29 24 - 39 %   Free Thyroxine Index 3.2 1.2 - 4.9  Vit D  25 hydroxy (rtn osteoporosis monitoring)  Result Value Ref Range   Vit D, 25-Hydroxy 33.5 30.0 - 100.0 ng/mL  Comprehensive metabolic panel  Result Value Ref Range   Glucose 93 65 - 99 mg/dL   BUN 14 6 - 20 mg/dL   Creatinine, Ser 9.56 0.57 - 1.00 mg/dL   GFR calc non Af Amer 80 >59 mL/min/1.73   GFR calc Af Amer 93 >59 mL/min/1.73   BUN/Creatinine Ratio 15 9 - 23   Sodium 141 134 - 144 mmol/L   Potassium 4.1 3.5 - 5.2 mmol/L   Chloride 103 96 - 106 mmol/L   CO2 25 20 - 29 mmol/L   Calcium 9.3 8.7 - 10.2 mg/dL   Total Protein 6.2 6.0 - 8.5 g/dL   Albumin 3.6 (L) 3.8 - 4.8 g/dL   Globulin, Total 2.6 1.5 - 4.5 g/dL   Albumin/Globulin Ratio 1.4 1.2 - 2.2   Bilirubin Total 0.4 0.0 - 1.2 mg/dL   Alkaline Phosphatase 132 (H) 39 - 117 IU/L   AST 21 0 - 40 IU/L   ALT 25 0 - 32 IU/L  HgB A1c  Result Value Ref Range   Hgb A1c MFr Bld 5.7 (H) 4.8 - 5.6 %   Est. average glucose Bld gHb Est-mCnc 117 mg/dL      Assessment & Plan:   Problem List Items Addressed This Visit      Musculoskeletal and Integument   Acne    Improving with Clind/Benzo gel.  Continue this treatment and adjust as needed.  Will consider referral to dermatology if worsening.          Other   Morbid obesity ([facility]) - Primary    Recommend focus on healthy diet choices and regular physical activity (30 minutes 5 days a week).  Once able to assist  with chronic pain will focus more on weight loss with patient.      Insomnia    Chronic, ongoing.  Continue CBD oil at home.  If worsening could consider Trazodone, which would benefit both mood and sleep pattern.  Return in 6 months or sooner if worsening.           I discussed the assessment and treatment plan with the patient. The patient was provided an opportunity to ask questions and all were answered. The patient agreed with the plan and  demonstrated an understanding of the instructions.   The patient was advised to call back or seek an in-person evaluation if the symptoms worsen or if the condition fails to improve as anticipated.   I provided 15 minutes of time during this encounter.  Follow up plan: Return in about 6 months (around 10/24/2019) for Annual physical if possible, if not then follow-up visit.

## 2019-04-30 NOTE — Progress Notes (Signed)
Called X2 Unable to reach patient will call again on 05/01/19 to see which she prefers.

## 2019-05-01 ENCOUNTER — Encounter: Payer: Self-pay | Admitting: Nurse Practitioner

## 2019-05-01 NOTE — Progress Notes (Signed)
Called pt, was unable to leave voicemail. Sent letter.

## 2019-05-09 ENCOUNTER — Encounter: Payer: Self-pay | Admitting: Nurse Practitioner

## 2019-05-17 ENCOUNTER — Encounter: Payer: Self-pay | Admitting: Nurse Practitioner

## 2019-05-17 ENCOUNTER — Other Ambulatory Visit: Payer: Self-pay | Admitting: Nurse Practitioner

## 2019-05-17 ENCOUNTER — Other Ambulatory Visit: Payer: Self-pay

## 2019-05-17 ENCOUNTER — Ambulatory Visit (INDEPENDENT_AMBULATORY_CARE_PROVIDER_SITE_OTHER): Payer: 59 | Admitting: Nurse Practitioner

## 2019-05-17 DIAGNOSIS — H6980 Other specified disorders of Eustachian tube, unspecified ear: Secondary | ICD-10-CM | POA: Insufficient documentation

## 2019-05-17 DIAGNOSIS — H6983 Other specified disorders of Eustachian tube, bilateral: Secondary | ICD-10-CM | POA: Diagnosis not present

## 2019-05-17 DIAGNOSIS — G894 Chronic pain syndrome: Secondary | ICD-10-CM

## 2019-05-17 DIAGNOSIS — H6990 Unspecified Eustachian tube disorder, unspecified ear: Secondary | ICD-10-CM | POA: Insufficient documentation

## 2019-05-17 MED ORDER — PREDNISONE 20 MG PO TABS: 40.0000 mg | ORAL_TABLET | Freq: Every day | ORAL | 0 refills | Status: AC

## 2019-05-17 NOTE — Patient Instructions (Signed)
Eustachian Tube Dysfunction ° °Eustachian tube dysfunction refers to a condition in which a blockage develops in the narrow passage that connects the middle ear to the back of the nose (eustachian tube). The eustachian tube regulates air pressure in the middle ear by letting air move between the ear and nose. It also helps to drain fluid from the middle ear space. °Eustachian tube dysfunction can affect one or both ears. When the eustachian tube does not function properly, air pressure, fluid, or both can build up in the middle ear. °What are the causes? °This condition occurs when the eustachian tube becomes blocked or cannot open normally. Common causes of this condition include: °· Ear infections. °· Colds and other infections that affect the nose, mouth, and throat (upper respiratory tract). °· Allergies. °· Irritation from cigarette smoke. °· Irritation from stomach acid coming up into the esophagus (gastroesophageal reflux). The esophagus is the tube that carries food from the mouth to the stomach. °· Sudden changes in air pressure, such as from descending in an airplane or scuba diving. °· Abnormal growths in the nose or throat, such as: °? Growths that line the nose (nasal polyps). °? Abnormal growth of cells (tumors). °? Enlarged tissue at the back of the throat (adenoids). °What increases the risk? °You are more likely to develop this condition if: °· You smoke. °· You are overweight. °· You are a child who has: °? Certain birth defects of the mouth, such as cleft palate. °? Large tonsils or adenoids. °What are the signs or symptoms? °Common symptoms of this condition include: °· A feeling of fullness in the ear. °· Ear pain. °· Clicking or popping noises in the ear. °· Ringing in the ear. °· Hearing loss. °· Loss of balance. °· Dizziness. °Symptoms may get worse when the air pressure around you changes, such as when you travel to an area of high elevation, fly on an airplane, or go scuba diving. °How is  this diagnosed? °This condition may be diagnosed based on: °· Your symptoms. °· A physical exam of your ears, nose, and throat. °· Tests, such as those that measure: °? The movement of your eardrum (tympanogram). °? Your hearing (audiometry). °How is this treated? °Treatment depends on the cause and severity of your condition. °· In mild cases, you may relieve your symptoms by moving air into your ears. This is called "popping the ears." °· In more severe cases, or if you have symptoms of fluid in your ears, treatment may include: °? Medicines to relieve congestion (decongestants). °? Medicines that treat allergies (antihistamines). °? Nasal sprays or ear drops that contain medicines that reduce swelling (steroids). °? A procedure to drain the fluid in your eardrum (myringotomy). In this procedure, a small tube is placed in the eardrum to: °§ Drain the fluid. °§ Restore the air in the middle ear space. °? A procedure to insert a balloon device through the nose to inflate the opening of the eustachian tube (balloon dilation). °Follow these instructions at home: °Lifestyle °· Do not do any of the following until your health care provider approves: °? Travel to high altitudes. °? Fly in airplanes. °? Work in a pressurized cabin or room. °? Scuba dive. °· Do not use any products that contain nicotine or tobacco, such as cigarettes and e-cigarettes. If you need help quitting, ask your health care provider. °· Keep your ears dry. Wear fitted earplugs during showering and bathing. Dry your ears completely after. °General instructions °· Take over-the-counter   and prescription medicines only as told by your health care provider. °· Use techniques to help pop your ears as recommended by your health care provider. These may include: °? Chewing gum. °? Yawning. °? Frequent, forceful swallowing. °? Closing your mouth, holding your nose closed, and gently blowing as if you are trying to blow air out of your nose. °· Keep all  follow-up visits as told by your health care provider. This is important. °Contact a health care provider if: °· Your symptoms do not go away after treatment. °· Your symptoms come back after treatment. °· You are unable to pop your ears. °· You have: °? A fever. °? Pain in your ear. °? Pain in your head or neck. °? Fluid draining from your ear. °· Your hearing suddenly changes. °· You become very dizzy. °· You lose your balance. °Summary °· Eustachian tube dysfunction refers to a condition in which a blockage develops in the eustachian tube. °· It can be caused by ear infections, allergies, inhaled irritants, or abnormal growths in the nose or throat. °· Symptoms include ear pain, hearing loss, or ringing in the ears. °· Mild cases are treated with maneuvers to unblock the ears, such as yawning or ear popping. °· Severe cases are treated with medicines. Surgery may also be done (rare). °This information is not intended to replace advice given to you by your health care provider. Make sure you discuss any questions you have with your health care provider. °Document Revised: 07/25/2017 Document Reviewed: 07/25/2017 °Elsevier Patient Education © 2020 [employer] ° °

## 2019-05-17 NOTE — Progress Notes (Signed)
BP 110/78   Pulse 97   Temp 98.1 F (36.7 C) (Oral)   Ht 5\' 7"  (1.702 m)   Wt (!) 411 lb (186.4 kg)   SpO2 96%   BMI 64.37 kg/m    Subjective:    Patient ID: Amy Larsen, female    DOB: 19-Aug-1980, 39 y.o.   MRN: [id number]  HPI: Amy Larsen is a 39 y.o. female  Chief Complaint  Patient presents with  . Ear Problem    left ear discomfort with loud noises. on and off x about 2 month   EAR DISCOMFORT Started last Thanksgiving, was watching a show and the sound was really loud, since that time has pressure and crackling in left ear and some in right, L>R.  Intermittent, not every day.  Every other day, sometimes is a mild amount and sometimes it is worse.  Feels like listening to somebody through static and has some popping.  Happens with different levels of sound, does not happen when wearing headphones.   Duration: months Involved ear(s): bilateral Quality:  Popping and crackling Fever: no Otorrhea: no Upper respiratory infection symptoms: no Pruritus: occasional Hearing loss: no Water immersion no Using Q-tips: yes Recurrent otitis media: no Status: fluctuating Treatments attempted: none  Relevant past medical, surgical, family and social history reviewed and updated as indicated. Interim medical history since our last visit reviewed. Allergies and medications reviewed and updated.  Review of Systems  Constitutional: Negative for activity change, appetite change, diaphoresis, fatigue and fever.  HENT: Negative for ear discharge, ear pain, hearing loss, nosebleeds, postnasal drip, rhinorrhea, sinus pressure and sore throat.   Respiratory: Negative for cough, chest tightness and shortness of breath.   Cardiovascular: Negative for chest pain, palpitations and leg swelling.  Endocrine: Negative for cold intolerance, heat intolerance, polydipsia, polyphagia and polyuria.  Neurological: Negative.   Psychiatric/Behavioral: Negative.     Per HPI unless  specifically indicated above     Objective:    BP 110/78   Pulse 97   Temp 98.1 F (36.7 C) (Oral)   Ht 5\' 7"  (1.702 m)   Wt (!) 411 lb (186.4 kg)   SpO2 96%   BMI 64.37 kg/m   Wt Readings from Last 3 Encounters:  05/17/19 (!) 411 lb (186.4 kg)  10/10/18 (!) 382 lb (173.3 kg)  02/09/16 (!) 345 lb (156.5 kg)    Physical Exam Vitals and nursing note reviewed.  Constitutional:      General: She is awake. She is not in acute distress.    Appearance: She is well-developed. She is morbidly obese. She is not ill-appearing.  HENT:     Head: Normocephalic.     Right Ear: Hearing normal. No decreased hearing noted. A middle ear effusion is present. There is no impacted cerumen. Tympanic membrane is not injected.     Left Ear: Hearing normal. No decreased hearing noted. A middle ear effusion is present. There is no impacted cerumen. Tympanic membrane is not injected.  Eyes:     General: Lids are normal.        Right eye: No discharge.        Left eye: No discharge.     Conjunctiva/sclera: Conjunctivae normal.     Pupils: Pupils are equal, round, and reactive to light.  Cardiovascular:     Rate and Rhythm: Normal rate and regular rhythm.     Heart sounds: Normal heart sounds. No murmur. No gallop.   Pulmonary:  Effort: Pulmonary effort is normal. No accessory muscle usage or respiratory distress.     Breath sounds: Normal breath sounds.  Abdominal:     General: Bowel sounds are normal.     Palpations: Abdomen is soft.  Musculoskeletal:     Cervical back: Normal range of motion and neck supple.     Right lower leg: No edema.     Left lower leg: No edema.  Skin:    General: Skin is warm and dry.  Neurological:     Mental Status: She is alert and oriented to person, place, and time.  Psychiatric:        Attention and Perception: Attention normal.        Mood and Affect: Mood normal.        Behavior: Behavior normal. Behavior is cooperative.        Thought Content: Thought  content normal.        Judgment: Judgment normal.     Results for orders placed or performed in visit on 03/29/19  Thyroid Panel With TSH  Result Value Ref Range   TSH 1.780 0.450 - 4.500 uIU/mL   T4, Total 11.0 4.5 - 12.0 ug/dL   T3 Uptake Ratio 29 24 - 39 %   Free Thyroxine Index 3.2 1.2 - 4.9  Vit D  25 hydroxy (rtn osteoporosis monitoring)  Result Value Ref Range   Vit D, 25-Hydroxy 33.5 30.0 - 100.0 ng/mL  Comprehensive metabolic panel  Result Value Ref Range   Glucose 93 65 - 99 mg/dL   BUN 14 6 - 20 mg/dL   Creatinine, Ser 4.26 0.57 - 1.00 mg/dL   GFR calc non Af Amer 80 >59 mL/min/1.73   GFR calc Af Amer 93 >59 mL/min/1.73   BUN/Creatinine Ratio 15 9 - 23   Sodium 141 134 - 144 mmol/L   Potassium 4.1 3.5 - 5.2 mmol/L   Chloride 103 96 - 106 mmol/L   CO2 25 20 - 29 mmol/L   Calcium 9.3 8.7 - 10.2 mg/dL   Total Protein 6.2 6.0 - 8.5 g/dL   Albumin 3.6 (L) 3.8 - 4.8 g/dL   Globulin, Total 2.6 1.5 - 4.5 g/dL   Albumin/Globulin Ratio 1.4 1.2 - 2.2   Bilirubin Total 0.4 0.0 - 1.2 mg/dL   Alkaline Phosphatase 132 (H) 39 - 117 IU/L   AST 21 0 - 40 IU/L   ALT 25 0 - 32 IU/L  HgB A1c  Result Value Ref Range   Hgb A1c MFr Bld 5.7 (H) 4.8 - 5.6 %   Est. average glucose Bld gHb Est-mCnc 117 mg/dL      Assessment & Plan:   Problem List Items Addressed This Visit      Nervous and Auditory   Eustachian tube dysfunction    Suspect eustachian tube dysfunction based on exam findings and HPI.  Will trial Prednisone 40 MG x 5 days.  Recommend no ear buds in ears and no loud television.  No Q-tips at home.  Return to office for worsening or continued issues OR notify provider.  Will refer to ENT if ongoing.          Follow up plan: Return if symptoms worsen or fail to improve.

## 2019-05-17 NOTE — Assessment & Plan Note (Signed)
Suspect eustachian tube dysfunction based on exam findings and HPI.  Will trial Prednisone 40 MG x 5 days.  Recommend no ear buds in ears and no loud television.  No Q-tips at home.  Return to office for worsening or continued issues OR notify provider.  Will refer to ENT if ongoing.

## 2019-08-06 ENCOUNTER — Encounter: Payer: Self-pay | Admitting: Nurse Practitioner

## 2019-09-24 ENCOUNTER — Other Ambulatory Visit: Payer: Self-pay

## 2019-09-24 ENCOUNTER — Ambulatory Visit
Payer: 59 | Attending: Student in an Organized Health Care Education/Training Program | Admitting: Student in an Organized Health Care Education/Training Program

## 2019-09-24 ENCOUNTER — Encounter: Payer: Self-pay | Admitting: Student in an Organized Health Care Education/Training Program

## 2019-09-24 DIAGNOSIS — M1612 Unilateral primary osteoarthritis, left hip: Secondary | ICD-10-CM | POA: Insufficient documentation

## 2019-09-24 DIAGNOSIS — Z79891 Long term (current) use of opiate analgesic: Secondary | ICD-10-CM | POA: Insufficient documentation

## 2019-09-24 DIAGNOSIS — G8929 Other chronic pain: Secondary | ICD-10-CM | POA: Diagnosis present

## 2019-09-24 DIAGNOSIS — M25552 Pain in left hip: Secondary | ICD-10-CM | POA: Diagnosis present

## 2019-09-24 DIAGNOSIS — M5442 Lumbago with sciatica, left side: Secondary | ICD-10-CM | POA: Insufficient documentation

## 2019-09-24 DIAGNOSIS — G894 Chronic pain syndrome: Secondary | ICD-10-CM | POA: Diagnosis present

## 2019-09-24 NOTE — Progress Notes (Signed)
Safety precautions to be maintained throughout the outpatient stay will include: orient to surroundings, keep bed in low position, maintain call bell within reach at all times, provide assistance with transfer out of bed and ambulation.  

## 2019-09-24 NOTE — Patient Instructions (Signed)
You have a referral to psychology. Call our office for appointment after the psych eval.  [phone]
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
## 2019-09-26 ENCOUNTER — Encounter: Payer: Self-pay | Admitting: Nurse Practitioner

## 2019-09-28 LAB — COMPLIANCE DRUG ANALYSIS, UR

## 2019-10-25 ENCOUNTER — Encounter: Payer: 59 | Admitting: Nurse Practitioner

## 2019-10-31 ENCOUNTER — Encounter: Payer: 59 | Admitting: Nurse Practitioner

## 2019-11-27 ENCOUNTER — Telehealth (INDEPENDENT_AMBULATORY_CARE_PROVIDER_SITE_OTHER): Payer: 59 | Admitting: Psychiatry

## 2019-11-27 ENCOUNTER — Other Ambulatory Visit: Payer: Self-pay

## 2019-11-27 DIAGNOSIS — Z5329 Procedure and treatment not carried out because of patient's decision for other reasons: Secondary | ICD-10-CM

## 2019-11-27 NOTE — Progress Notes (Signed)
No response to call or text or video invite  

## 2019-12-16 ENCOUNTER — Other Ambulatory Visit: Payer: Self-pay

## 2019-12-16 ENCOUNTER — Ambulatory Visit (INDEPENDENT_AMBULATORY_CARE_PROVIDER_SITE_OTHER): Payer: 59 | Admitting: Nurse Practitioner

## 2019-12-16 ENCOUNTER — Encounter: Payer: Self-pay | Admitting: Nurse Practitioner

## 2019-12-16 VITALS — BP 110/72 | HR 97 | Temp 98.6°F | Ht 66.5 in | Wt >= 6400 oz

## 2019-12-16 DIAGNOSIS — Z1159 Encounter for screening for other viral diseases: Secondary | ICD-10-CM | POA: Diagnosis not present

## 2019-12-16 DIAGNOSIS — M5442 Lumbago with sciatica, left side: Secondary | ICD-10-CM | POA: Diagnosis not present

## 2019-12-16 DIAGNOSIS — E559 Vitamin D deficiency, unspecified: Secondary | ICD-10-CM

## 2019-12-16 DIAGNOSIS — F4310 Post-traumatic stress disorder, unspecified: Secondary | ICD-10-CM | POA: Diagnosis not present

## 2019-12-16 DIAGNOSIS — G894 Chronic pain syndrome: Secondary | ICD-10-CM

## 2019-12-16 DIAGNOSIS — Z0000 Encounter for general adult medical examination without abnormal findings: Secondary | ICD-10-CM

## 2019-12-16 DIAGNOSIS — Z23 Encounter for immunization: Secondary | ICD-10-CM | POA: Diagnosis not present

## 2019-12-16 DIAGNOSIS — E039 Hypothyroidism, unspecified: Secondary | ICD-10-CM | POA: Diagnosis not present

## 2019-12-16 DIAGNOSIS — G8929 Other chronic pain: Secondary | ICD-10-CM

## 2019-12-16 DIAGNOSIS — F5104 Psychophysiologic insomnia: Secondary | ICD-10-CM

## 2019-12-16 DIAGNOSIS — Z862 Personal history of diseases of the blood and blood-forming organs and certain disorders involving the immune mechanism: Secondary | ICD-10-CM

## 2019-12-16 DIAGNOSIS — R600 Localized edema: Secondary | ICD-10-CM

## 2019-12-16 NOTE — Assessment & Plan Note (Signed)
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  She denies need for medication at this time as had lengthy therapy time in past and utilizes tools taught.  Discussed option of Trazodone for sleep or Duloxetine which would benefit both mood and chronic pain.  She wishes to research these further.  Adjust plan of care as needed. 

## 2019-12-16 NOTE — Assessment & Plan Note (Signed)
Chronic, ongoing.  Did not have success with pain clinic and does not wish to return at this time.  Would benefit from pain evaluation and recommendations.  At this time continue at home regimen, including CBD. 

## 2019-12-16 NOTE — Assessment & Plan Note (Signed)
Chronic, ongoing.  Continue CBD oil at home.  If worsening could consider Trazodone, which would benefit both mood and sleep pattern.  Return in 6 months or sooner if worsening.

## 2019-12-16 NOTE — Assessment & Plan Note (Signed)
Ongoing for years, suspect some lymphedema due to obesity.  Continue Lasix PRN.  Discussed with patient, may benefit from massage therapy for edema.  Continue TED hose and DASH diet at home.

## 2019-12-16 NOTE — Assessment & Plan Note (Addendum)
Ongoing, stable.  Recheck Vit D level and adjust regimen as needed.

## 2019-12-16 NOTE — Patient Instructions (Signed)
Hypothyroidism  Hypothyroidism is when the thyroid gland does not make enough of certain hormones (it is underactive). The thyroid gland is a small gland located in the lower front part of the neck, just in front of the windpipe (trachea). This gland makes hormones that help control how the body uses food for energy (metabolism) as well as how the heart and brain function. These hormones also play a role in keeping your bones strong. When the thyroid is underactive, it produces too little of the hormones thyroxine (T4) and triiodothyronine (T3). What are the causes? This condition may be caused by:  Hashimoto's disease. This is a disease in which the body's disease-fighting system (immune system) attacks the thyroid gland. This is the most common cause.  Viral infections.  Pregnancy.  Certain medicines.  Birth defects.  Past radiation treatments to the head or neck for cancer.  Past treatment with radioactive iodine.  Past exposure to radiation in the environment.  Past surgical removal of part or all of the thyroid.  Problems with a gland in the center of the brain (pituitary gland).  Lack of enough iodine in the diet. What increases the risk? You are more likely to develop this condition if:  You are female.  You have a family history of thyroid conditions.  You use a medicine called lithium.  You take medicines that affect the immune system (immunosuppressants). What are the signs or symptoms? Symptoms of this condition include:  Feeling as though you have no energy (lethargy).  Not being able to tolerate cold.  Weight gain that is not explained by a change in diet or exercise habits.  Lack of appetite.  Dry skin.  Coarse hair.  Menstrual irregularity.  Slowing of thought processes.  Constipation.  Sadness or depression. How is this diagnosed? This condition may be diagnosed based on:  Your symptoms, your medical history, and a physical exam.  Blood  tests. You may also have imaging tests, such as an ultrasound or MRI. How is this treated? This condition is treated with medicine that replaces the thyroid hormones that your body does not make. After you begin treatment, it may take several weeks for symptoms to go away. Follow these instructions at home:  Take over-the-counter and prescription medicines only as told by your health care provider.  If you start taking any new medicines, tell your health care provider.  Keep all follow-up visits as told by your health care provider. This is important. ? As your condition improves, your dosage of thyroid hormone medicine may change. ? You will need to have blood tests regularly so that your health care provider can monitor your condition. Contact a health care provider if:  Your symptoms do not get better with treatment.  You are taking thyroid replacement medicine and you: ? Sweat a lot. ? Have tremors. ? Feel anxious. ? Lose weight rapidly. ? Cannot tolerate heat. ? Have emotional swings. ? Have diarrhea. ? Feel weak. Get help right away if you have:  Chest pain.  An irregular heartbeat.  A rapid heartbeat.  Difficulty breathing. Summary  Hypothyroidism is when the thyroid gland does not make enough of certain hormones (it is underactive).  When the thyroid is underactive, it produces too little of the hormones thyroxine (T4) and triiodothyronine (T3).  The most common cause is Hashimoto's disease, a disease in which the body's disease-fighting system (immune system) attacks the thyroid gland. The condition can also be caused by viral infections, medicine, pregnancy, or past   radiation treatment to the head or neck.  Symptoms may include weight gain, dry skin, constipation, feeling as though you do not have energy, and not being able to tolerate cold.  This condition is treated with medicine to replace the thyroid hormones that your body does not make. This information  is not intended to replace advice given to you by your health care provider. Make sure you discuss any questions you have with your health care provider. Document Revised: 03/17/2017 Document Reviewed: 03/15/2017 Elsevier Patient Education  2020 [employer]  

## 2019-12-16 NOTE — Assessment & Plan Note (Signed)
Chronic, ongoing with recent TSH in normal range.  Recheck thyroid panel today and adjust medication as needed.

## 2019-12-16 NOTE — Assessment & Plan Note (Signed)
Is focused on weight loss and regular exercise.  Recommended eating smaller high protein, low fat meals more frequently and exercising 30 mins a day 5 times a week with a goal of 10-15lb weight loss in the next 3 months. Patient voiced their understanding and motivation to adhere to these recommendations.
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
## 2019-12-17 ENCOUNTER — Other Ambulatory Visit: Payer: Self-pay | Admitting: Nurse Practitioner

## 2019-12-17 DIAGNOSIS — D72820 Lymphocytosis (symptomatic): Secondary | ICD-10-CM

## 2019-12-17 LAB — VITAMIN D 25 HYDROXY (VIT D DEFICIENCY, FRACTURES): Vit D, 25-Hydroxy: 25.1 ng/mL — ABNORMAL LOW (ref 30.0–100.0)

## 2019-12-17 LAB — COMPREHENSIVE METABOLIC PANEL
ALT: 27 IU/L (ref 0–32)
AST: 20 IU/L (ref 0–40)
Albumin/Globulin Ratio: 1.3 (ref 1.2–2.2)
Albumin: 3.7 g/dL — ABNORMAL LOW (ref 3.8–4.8)
Alkaline Phosphatase: 119 IU/L (ref 48–121)
BUN/Creatinine Ratio: 17 (ref 9–23)
BUN: 17 mg/dL (ref 6–20)
Bilirubin Total: 0.3 mg/dL (ref 0.0–1.2)
CO2: 21 mmol/L (ref 20–29)
Calcium: 9.2 mg/dL (ref 8.7–10.2)
Chloride: 104 mmol/L (ref 96–106)
Creatinine, Ser: 1.02 mg/dL — ABNORMAL HIGH (ref 0.57–1.00)
GFR calc Af Amer: 80 mL/min/{1.73_m2} (ref >59)
GFR calc non Af Amer: 69 mL/min/{1.73_m2} (ref >59)
Globulin, Total: 2.9 g/dL (ref 1.5–4.5)
Glucose: 85 mg/dL (ref 65–99)
Potassium: 4.2 mmol/L (ref 3.5–5.2)
Sodium: 139 mmol/L (ref 134–144)
Total Protein: 6.6 g/dL (ref 6.0–8.5)

## 2019-12-17 LAB — CBC WITH DIFFERENTIAL/PLATELET
Basophils Absolute: 0.1 10*3/uL (ref 0.0–0.2)
Basos: 1 %
EOS (ABSOLUTE): 0.3 10*3/uL (ref 0.0–0.4)
Eos: 2 %
Hematocrit: 44.1 % (ref 34.0–46.6)
Hemoglobin: 13.8 g/dL (ref 11.1–15.9)
Immature Grans (Abs): 0.1 10*3/uL (ref 0.0–0.1)
Immature Granulocytes: 1 %
Lymphocytes Absolute: 2.4 10*3/uL (ref 0.7–3.1)
Lymphs: 19 %
MCH: 25.9 pg — ABNORMAL LOW (ref 26.6–33.0)
MCHC: 31.3 g/dL — ABNORMAL LOW (ref 31.5–35.7)
MCV: 83 fL (ref 79–97)
Monocytes Absolute: 1.1 10*3/uL — ABNORMAL HIGH (ref 0.1–0.9)
Monocytes: 8 %
Neutrophils Absolute: 8.9 10*3/uL — ABNORMAL HIGH (ref 1.4–7.0)
Neutrophils: 69 %
Platelets: 444 10*3/uL (ref 150–450)
RBC: 5.33 x10E6/uL — ABNORMAL HIGH (ref 3.77–5.28)
RDW: 14.4 % (ref 11.7–15.4)
WBC: 12.8 10*3/uL — ABNORMAL HIGH (ref 3.4–10.8)

## 2019-12-17 LAB — IRON,TIBC AND FERRITIN PANEL
Ferritin: 108 ng/mL (ref 15–150)
Iron Saturation: 11 % — ABNORMAL LOW (ref 15–55)
Iron: 32 ug/dL (ref 27–159)
Total Iron Binding Capacity: 297 ug/dL (ref 250–450)
UIBC: 265 ug/dL (ref 131–425)

## 2019-12-17 LAB — LIPID PANEL W/O CHOL/HDL RATIO
Cholesterol, Total: 160 mg/dL (ref 100–199)
HDL: 44 mg/dL (ref >39)
LDL Chol Calc (NIH): 101 mg/dL — ABNORMAL HIGH (ref 0–99)
Triglycerides: 76 mg/dL (ref 0–149)
VLDL Cholesterol Cal: 15 mg/dL (ref 5–40)

## 2019-12-17 LAB — TSH: TSH: 2.05 u[IU]/mL (ref 0.450–4.500)

## 2019-12-17 LAB — T4, FREE: Free T4: 1.53 ng/dL (ref 0.82–1.77)

## 2019-12-17 LAB — HEPATITIS C ANTIBODY: Hep C Virus Ab: <0.1 s/co ratio (ref 0.0–0.9)

## 2019-12-17 NOTE — Progress Notes (Signed)
Contacted via MyChart  Good morning Amy Larsen, labs have returned: - Hep C is negative -- kidney function and liver normal - Thyroid tests in normal range, can continue current medication dose. - Iron level is a little on low side of normal, you would benefit from starting daily iron supplement, I use Vitron which has Vitamin C in it to help with iron absorption in the belly.  I recommend this or Slow Fe over the counter. - Vitamin D level remains on low side, would recommend increasing dose daily by 1000 units for extra benefit. - LDL, bad cholesterol, remains elevated, continue diet focus. - CBC did show some elevation in white blood cell count and neutrophils, you had mentioned being sick recently.  I would like to recheck this via an outpatient lab only in 4 weeks.  Can you schedule this with office?  I want to make sure this trends back down.  Thank you.  Any questions? Keep being awesome!!  Thank you for allowing me to participate in your care. Kindest regards, Annaliesa Blann

## 2020-01-06 ENCOUNTER — Other Ambulatory Visit: Payer: Self-pay | Admitting: Nurse Practitioner

## 2020-01-06 MED ORDER — FUROSEMIDE 40 MG PO TABS: 40.0000 mg | ORAL_TABLET | ORAL | 0 refills | Status: DC

## 2020-04-20 ENCOUNTER — Other Ambulatory Visit: Payer: Self-pay | Admitting: Nurse Practitioner

## 2020-04-20 MED ORDER — LEVOTHYROXINE SODIUM 88 MCG PO TABS
88.0000 ug | ORAL_TABLET | Freq: Every day | ORAL | 4 refills | Status: DC
Start: 2020-04-20 — End: 2020-07-14

## 2020-06-15 ENCOUNTER — Encounter: Payer: Self-pay | Admitting: Nurse Practitioner

## 2020-06-15 ENCOUNTER — Other Ambulatory Visit: Payer: Self-pay

## 2020-06-15 ENCOUNTER — Ambulatory Visit (INDEPENDENT_AMBULATORY_CARE_PROVIDER_SITE_OTHER): Payer: 59 | Admitting: Nurse Practitioner

## 2020-06-15 DIAGNOSIS — F4310 Post-traumatic stress disorder, unspecified: Secondary | ICD-10-CM

## 2020-06-15 DIAGNOSIS — M5442 Lumbago with sciatica, left side: Secondary | ICD-10-CM | POA: Diagnosis not present

## 2020-06-15 DIAGNOSIS — E559 Vitamin D deficiency, unspecified: Secondary | ICD-10-CM

## 2020-06-15 DIAGNOSIS — E039 Hypothyroidism, unspecified: Secondary | ICD-10-CM

## 2020-06-15 DIAGNOSIS — G8929 Other chronic pain: Secondary | ICD-10-CM

## 2020-06-15 NOTE — Assessment & Plan Note (Signed)
Ongoing, stable.  Recheck Vit D level next visit and adjust regimen as needed.

## 2020-06-15 NOTE — Assessment & Plan Note (Signed)
Is focused on weight loss and regular exercise.  Recommended eating smaller high protein, low fat meals more frequently and exercising 30 mins a day 5 times a week with a goal of 10-15lb weight loss in the next 3 months. Patient voiced their understanding and motivation to adhere to these recommendations.  Is interested in medication for weight loss -- provided information to her on Saxenda to read over.  She is interested in this, but would like to review first.

## 2020-06-15 NOTE — Assessment & Plan Note (Signed)
Chronic, ongoing with recent TSH in normal range.  Recheck thyroid levels at next visit in August for physical.
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
## 2020-06-15 NOTE — Patient Instructions (Addendum)
Liraglutide injection (Weight Management) What is this medicine? LIRAGLUTIDE (LIR a GLOO tide) is used to help people lose weight and maintain weight loss. It is used with a reduced-calorie diet and exercise. This medicine may be used for other purposes; ask your health care provider or pharmacist if you have questions. COMMON BRAND NAME(S): Saxenda What should I tell my health care provider before I take this medicine? They need to know if you have any of these conditions:  endocrine tumors (MEN 2) or if someone in your family had these tumors  gallbladder disease  high cholesterol  history of alcohol abuse problem  history of pancreatitis  kidney disease or if you are on dialysis  liver disease  previous swelling of the tongue, face, or lips with difficulty breathing, difficulty swallowing, hoarseness, or tightening of the throat  stomach problems  suicidal thoughts, plans, or attempt; a previous suicide attempt by you or a family member  thyroid cancer or if someone in your family had thyroid cancer  an unusual or allergic reaction to liraglutide, other medicines, foods, dyes, or preservatives  pregnant or trying to get pregnant  breast-feeding How should I use this medicine? This medicine is for injection under the skin of your upper leg, stomach area, or upper arm. You will be taught how to prepare and give this medicine. Use exactly as directed. Take your medicine at regular intervals. Do not take it more often than directed. This medicine comes with INSTRUCTIONS FOR USE. Ask your pharmacist for directions on how to use this medicine. Read the information carefully. Talk to your pharmacist or health care provider if you have questions. It is important that you put your used needles and syringes in a special sharps container. Do not put them in a trash can. If you do not have a sharps container, call your pharmacist or healthcare provider to get one. A special MedGuide  will be given to you by the pharmacist with each prescription and refill. Be sure to read this information carefully each time. Talk to your health care provider about the use of this medicine in children. While it may be prescribed for children as young as 12 years of age for selected conditions, precautions do apply. Overdosage: If you think you have taken too much of this medicine contact a poison control center or emergency room at once. NOTE: This medicine is only for you. Do not share this medicine with others. What if I miss a dose? If you miss a dose, take it as soon as you can. If it is almost time for your next dose, take only that dose. Do not take double or extra doses. If you miss your dose for 3 days or more, call your doctor or health care professional to talk about how to restart this medicine. What may interact with this medicine?  insulin and other medicines for diabetes This list may not describe all possible interactions. Give your health care provider a list of all the medicines, herbs, non-prescription drugs, or dietary supplements you use. Also tell them if you smoke, drink alcohol, or use illegal drugs. Some items may interact with your medicine. What should I watch for while using this medicine? Visit your doctor or health care professional for regular checks on your progress. Drink plenty of fluids while taking this medicine. Check with your doctor or health care professional if you get an attack of severe diarrhea, nausea, and vomiting. The loss of too much body fluid can make it   dangerous for you to take this medicine. This medicine may affect blood sugar levels. Ask your healthcare provider if changes in diet or medicines are needed if you have diabetes. Patients and their families should watch out for worsening depression or thoughts of suicide. Also watch out for sudden changes in feelings such as feeling anxious, agitated, panicky, irritable, hostile, aggressive,  impulsive, severely restless, overly excited and hyperactive, or not being able to sleep. If this happens, especially at the beginning of treatment or after a change in dose, call your health care professional. Women should inform their health care provider if they wish to become pregnant or think they might be pregnant. Losing weight while pregnant is not advised and may cause harm to the unborn child. Talk to your health care provider for more information. What side effects may I notice from receiving this medicine? Side effects that you should report to your doctor or health care professional as soon as possible:  allergic reactions like skin rash, itching or hives, swelling of the face, lips, or tongue  breathing problems  diarrhea that continues or is severe  lump or swelling on the neck  severe nausea  signs and symptoms of infection like fever or chills; cough; sore throat; pain or trouble passing urine  signs and symptoms of low blood sugar such as feeling anxious; confusion; dizziness; increased hunger; unusually weak or tired; increased sweating; shakiness; cold, clammy skin; irritable; headache; blurred vision; fast heartbeat; loss of consciousness  signs and symptoms of kidney injury like trouble passing urine or change in the amount of urine  trouble swallowing  unusual stomach upset or pain  vomiting Side effects that usually do not require medical attention (report to your doctor or health care professional if they continue or are bothersome):  constipation  decreased appetite  diarrhea  fatigue  headache  nausea  pain, redness, or irritation at site where injected  stomach upset  stuffy or runny nose This list may not describe all possible side effects. Call your doctor for medical advice about side effects. You may report side effects to FDA at 1-800-FDA-1088. Where should I keep my medicine? Keep out of the reach of children. Store unopened pen in a  refrigerator between 2 and 8 degrees C (36 and 46 degrees F). Do not freeze or use if the medicine has been frozen. Protect from light and excessive heat. After you first use the pen, it can be stored at room temperature between 15 and 30 degrees C (59 and 86 degrees F) or in a refrigerator. Throw away your used pen after 30 days or after the expiration date, whichever comes first. Do not store your pen with the needle attached. If the needle is left on, medicine may leak from the pen. NOTE: This sheet is a summary. It may not cover all possible information. If you have questions about this medicine, talk to your doctor, pharmacist, or health care provider.  2021 Elsevier/Gold Standard (2019-03-28 13:56:25)    Chronic Back Pain When back pain lasts longer than 3 months, it is called chronic back pain. Pain may get worse at certain times (flare-ups). There are things you can do at home to manage your pain. Follow these instructions at home: Pay attention to any changes in your symptoms. Take these actions to help with your pain: Managing pain and stiffness  If told, put ice on the painful area. Your doctor may tell you to use ice for 24-48 hours after the flare-up starts.  To do this: ? Put ice in a plastic bag. ? Place a towel between your skin and the bag. ? Leave the ice on for 20 minutes, 2-3 times a day.  If told, put heat on the painful area. Do this as often as told by your doctor. Use the heat source that your doctor recommends, such as a moist heat pack or a heating pad. ? Place a towel between your skin and the heat source. ? Leave the heat on for 20-30 minutes. ? Take off the heat if your skin turns bright red. This is especially important if you are unable to feel pain, heat, or cold. You may have a greater risk of getting burned.  Soak in a warm bath. This can help relieve pain.      Activity  Avoid bending and other activities that make pain worse.  When standing: ? Keep  your upper back and neck straight. ? Keep your shoulders pulled back. ? Avoid slouching.  When sitting: ? Keep your back straight. ? Relax your shoulders. Do not round your shoulders or pull them backward.  Do not sit or stand in one place for long periods of time.  Take short rest breaks during the day. Lying down or standing is usually better than sitting. Resting can help relieve pain.  When sitting or lying down for a long time, do some mild activity or stretching. This will help to prevent stiffness and pain.  Get regular exercise. Ask your doctor what activities are safe for you.  Do not lift anything that is heavier than 10 lb (4.5 kg) or the limit that you are told, until your doctor says that it is safe.  To prevent injury when you lift things: ? Bend your knees. ? Keep the weight close to your body. ? Avoid twisting.  Sleep on a firm mattress. Try lying on your side with your knees slightly bent. If you lie on your back, put a pillow under your knees.   Medicines  Treatment may include medicines for pain and swelling taken by mouth or put on the skin, prescription pain medicine, or muscle relaxants.  Take over-the-counter and prescription medicines only as told by your doctor.  Ask your doctor if the medicine prescribed to you: ? Requires you to avoid driving or using machinery. ? Can cause trouble pooping (constipation). You may need to take these actions to prevent or treat trouble pooping:  Drink enough fluid to keep your pee (urine) pale yellow.  Take over-the-counter or prescription medicines.  Eat foods that are high in fiber. These include beans, whole grains, and fresh fruits and vegetables.  Limit foods that are high in fat and sugars. These include fried or sweet foods. General instructions  Do not use any products that contain nicotine or tobacco, such as cigarettes, e-cigarettes, and chewing tobacco. If you need help quitting, ask your doctor.  Keep  all follow-up visits as told by your doctor. This is important. Contact a doctor if:  Your pain does not get better with rest or medicine.  Your pain gets worse, or you have new pain.  You have a high fever.  You lose weight very quickly.  You have trouble doing your normal activities. Get help right away if:  One or both of your legs or feet feel weak.  One or both of your legs or feet lose feeling (have numbness).  You have trouble controlling when you poop (have a bowel movement) or pee (urinate).  You have bad back pain and: ? You feel like you may vomit (nauseous), or you vomit. ? You have pain in your belly (abdomen). ? You have shortness of breath. ? You faint. Summary  When back pain lasts longer than 3 months, it is called chronic back pain.  Pain may get worse at certain times (flare-ups).  Use ice and heat as told by your doctor. Your doctor may tell you to use ice after flare-ups. This information is not intended to replace advice given to you by your health care provider. Make sure you discuss any questions you have with your health care provider. Document Revised: 05/15/2019 Document Reviewed: 05/15/2019 Elsevier Patient Education  2021 [employer].

## 2020-06-15 NOTE — Assessment & Plan Note (Signed)
Chronic, ongoing.  Did not have success with pain clinic and does not wish to return at this time.  Would benefit from pain evaluation and recommendations.  At this time continue at home regimen, including CBD.

## 2020-07-13 ENCOUNTER — Other Ambulatory Visit: Payer: Self-pay | Admitting: Nurse Practitioner

## 2020-07-14 ENCOUNTER — Other Ambulatory Visit: Payer: Self-pay | Admitting: Nurse Practitioner

## 2020-07-14 MED ORDER — FUROSEMIDE 40 MG PO TABS: 40.0000 mg | ORAL_TABLET | ORAL | 4 refills | Status: DC

## 2020-07-14 MED ORDER — LEVOTHYROXINE SODIUM 88 MCG PO TABS
88.0000 ug | ORAL_TABLET | Freq: Every day | ORAL | 4 refills | Status: DC
Start: 2020-07-14 — End: 2021-05-25

## 2020-07-14 NOTE — Telephone Encounter (Signed)
Patient last seen 06/15/20

## 2020-08-18 ENCOUNTER — Other Ambulatory Visit: Payer: Self-pay

## 2020-08-18 ENCOUNTER — Encounter: Payer: Self-pay | Admitting: Nurse Practitioner

## 2020-08-18 ENCOUNTER — Ambulatory Visit (INDEPENDENT_AMBULATORY_CARE_PROVIDER_SITE_OTHER): Payer: 59 | Admitting: Nurse Practitioner

## 2020-08-18 DIAGNOSIS — B351 Tinea unguium: Secondary | ICD-10-CM | POA: Diagnosis not present

## 2020-08-18 DIAGNOSIS — J3489 Other specified disorders of nose and nasal sinuses: Secondary | ICD-10-CM | POA: Diagnosis not present

## 2020-08-18 DIAGNOSIS — N907 Vulvar cyst: Secondary | ICD-10-CM | POA: Diagnosis not present

## 2020-08-18 DIAGNOSIS — B070 Plantar wart: Secondary | ICD-10-CM

## 2020-08-18 MED ORDER — LORATADINE 10 MG PO TABS: 10.0000 mg | ORAL_TABLET | Freq: Every day | ORAL | 11 refills | Status: DC

## 2020-08-18 MED ORDER — PREDNISONE 20 MG PO TABS: 40.0000 mg | ORAL_TABLET | Freq: Every day | ORAL | 0 refills | Status: AC

## 2020-08-18 MED ORDER — DOXYCYCLINE HYCLATE 100 MG PO TABS: 100.0000 mg | ORAL_TABLET | Freq: Two times a day (BID) | ORAL | 0 refills | Status: AC

## 2020-08-18 MED ORDER — CICLOPIROX 8 % EX SOLN: Freq: Every day | CUTANEOUS | 0 refills | Status: DC

## 2020-08-18 NOTE — Assessment & Plan Note (Signed)
BMI 65.25.  Recommended eating smaller high protein, low fat meals more frequently and exercising 30 mins a day 5 times a week with a goal of 10-15lb weight loss in the next 3 months. Patient voiced their understanding and motivation to adhere to these recommendations.

## 2020-08-18 NOTE — Assessment & Plan Note (Signed)
Chronic underlying allergic rhinitis with current acute flare.  Will send in Prednisone 40 MG daily x 5 days and Claritin to start taking 10 MG daily.  Discussed with patient, will have return in 4 weeks for follow-up.  Could consider addition of Singulair in future if poor response to OTC methods.

## 2020-08-18 NOTE — Assessment & Plan Note (Signed)
To right foot, mainly 1st and 2nd toenails. Will trial Penlac therapy and referral to podiatry.  Recommend keeping feet dry and discussed fungal disease.

## 2020-08-18 NOTE — Assessment & Plan Note (Signed)
To left lower labia, improving, smaller.  Recommend warm compresses 3 times a day to area.  Will send in Doxycycline 100 MG BID x 7 days to prevent infection, area mildly red and firm.  Return in 4 weeks for follow-up, sooner if worsening then would consider GYN.

## 2020-08-18 NOTE — Patient Instructions (Signed)
Sinus Headache  A sinus headache occurs when your sinuses become clogged or swollen. Sinuses are air-filled spaces in your skull that are behind the bones of your face and forehead. Sinus headaches can range from mild to severe. What are the causes? A sinus headache can result from various conditions that affect the sinuses. Common causes include:  Colds.  Sinus infections.  Allergies. Many people confuse sinus headaches with migraines or tension headaches because both headaches can cause facial pain and nasal symptoms. What are the signs or symptoms? The main symptom of this condition is a headache that may feel like pain or pressure in your face, forehead, ears, or upper teeth. People who have a sinus headache often have other symptoms, such as:  Congested or runny nose.  Fever.  Inability to smell. Weather changes can make symptoms worse. How is this diagnosed? This condition may be diagnosed based on:  A physical exam and medical history.  Imaging tests, such as a CT scan or MRI, to check for problems with the sinuses.  Examination of the sinuses using a thin tool with a camera that is inserted through your nose (endoscopy). How is this treated? Treatment for this condition depends on the cause.  Sinus pain that is caused by a sinus infection may be treated with antibiotic medicine.  Sinus pain that is caused by congestion may be helped by rinsing out (flushing) the nose and sinuses with saline solution.  Sinus pain that is caused by allergies may be helped by allergy medicines (antihistamines) and medicated nasal sprays.  Sinus surgery may be needed in some cases if other treatments do not help. Follow these instructions at home: General instructions  If directed: ? Apply a warm, moist washcloth to your face to help relieve pain. ? Use a nasal saline wash. Hydrate and humidify  Drink enough water to keep your urine clear or pale yellow. Staying hydrated will help  to thin your mucus.  Use a cool mist humidifier to keep the humidity level in your home above 50%.  Inhale steam for 10-15 minutes, 3-4 times a day or as told by your health care provider. You can do this in the bathroom while a hot shower is running.  Limit your exposure to cool or dry air. Medicines  Take over-the-counter and prescription medicines only as told by your health care provider.  If you were prescribed an antibiotic medicine, take it as told by your health care provider. Do not stop taking the antibiotic even if you start to feel better.  If you have congestion, use a nasal spray to help lessen pressure.   Contact a health care provider if:  You have a headache more than one time a week.  You have sensitivity to light or sound.  You develop a fever.  You feel nauseous or you vomit.  Your headaches do not get better with treatment. Many people think that they have a sinus headache when they actually have a migraine or a tension headache. Get help right away if:  You have vision problems.  You have sudden, severe pain in your face or head.  You have a seizure.  You are confused.  You have a stiff neck. Summary  A sinus headache occurs when your sinuses become clogged or swollen.  A sinus headache can result from various conditions that affect the sinuses, such as a cold, a sinus infection, or an allergy.  Treatment for this condition depends on the cause. It may include   medicine, such as antibiotics or antihistamines. This information is not intended to replace advice given to you by your health care provider. Make sure you discuss any questions you have with your health care provider. Document Revised: 01/14/2020 Document Reviewed: 01/14/2020 Elsevier Patient Education  2021 [employer]  

## 2020-08-18 NOTE — Assessment & Plan Note (Signed)
Current to left foot, with history of same.  Referral to podiatry.

## 2020-08-18 NOTE — Progress Notes (Signed)
BP 136/83   Pulse 96   Temp 99 F (37.2 C) (Oral)   Wt (!) 410 lb 6.4 oz (186.2 kg)   SpO2 98%   BMI 65.25 kg/m    Subjective:    Patient ID: Amy Larsen, female    DOB: 1980-11-18, 40 y.o.   MRN: [id number]  HPI: Amy Larsen is a 40 y.o. female  Chief Complaint  Patient presents with  . Bump on Labia    Patient states she noticed it last Thursday. Patient states Thursday it was red, swollen and very irritated and states it was big and since then it has gotten smaller. Patient denies having any drainage. Patient states it seems to decrease in size when she takes a hot steamy shower.    . Nail Problem    Patient states she has been having some issues with her right foot toenail of it being thick and growing differently and breaking off.   . Sinus Problem    Patient states lately she has been having a lot of sinus headaches and states she has a little cough, that causes drainage down her throat. Patient denies having fevers.    SKIN LESION Noticed a bump on labia Thursday night, it was larger (shaped like a pill on inside of labia), was angry and red -- much smaller now.  No drainage.  Pain has improved.  No history of herpes.  No history of similar.    Also having issues with toenails on right foot, they break off and grow differently.  Also has plantars wart on bottom of left foot.   Duration: days Location: left labia Painful: pain is improving Itching: no Onset: sudden Context: smaller Associated signs and symptoms: smaller History of skin cancer: no History of precancerous skin lesions: no Family history of skin cancer: no  SINUS PROBLEMS Having issues with sinuses x 2 years.  With worsening this year.  Can not use nasal sprays.  Not taking any allergy medications.  Recent sinus headaches started this morning. Fever: no Cough: occasional itchy cough Shortness of breath: no Wheezing: no Chest pain: no Chest tightness: no Chest congestion: no Nasal  congestion: little bit Runny nose: no Post nasal drip: yes Sneezing: yes Sore throat: no Swollen glands: no Sinus pressure: yes Headache: yes Face pain: no Toothache: no Ear pain: none Ear pressure: none Eyes red/itching:no Eye drainage/crusting: no  Vomiting: no Rash: no Fatigue: no Sick contacts: no Strep contacts: no  Context: fluctuating Recurrent sinusitis: no Relief with OTC cold/cough medications: no  Treatments attempted: none   Relevant past medical, surgical, family and social history reviewed and updated as indicated. Interim medical history since our last visit reviewed. Allergies and medications reviewed and updated.  Review of Systems  Constitutional: Negative for activity change, appetite change, diaphoresis, fatigue and fever.  HENT: Positive for postnasal drip, rhinorrhea, sinus pressure and sneezing. Negative for congestion and sore throat.   Respiratory: Positive for cough. Negative for chest tightness and shortness of breath.   Cardiovascular: Negative for chest pain, palpitations and leg swelling.  Neurological: Negative.   Psychiatric/Behavioral: Negative.     Per HPI unless specifically indicated above     Objective:    BP 136/83   Pulse 96   Temp 99 F (37.2 C) (Oral)   Wt (!) 410 lb 6.4 oz (186.2 kg)   SpO2 98%   BMI 65.25 kg/m   Wt Readings from Last 3 Encounters:  08/18/20 (!) 410 lb 6.4 oz (  186.2 kg)  06/15/20 (!) 404 lb (183.3 kg)  12/16/19 (!) 411 lb (186.4 kg)    Physical Exam Vitals and nursing note reviewed. Exam conducted with a chaperone present.  Constitutional:      General: She is awake. She is not in acute distress.    Appearance: She is well-developed and well-groomed. She is morbidly obese. She is not ill-appearing.  HENT:     Head: Normocephalic.     Right Ear: Hearing, ear canal and external ear normal. No drainage. A middle ear effusion is present.     Left Ear: Hearing, ear canal and external ear normal. No  drainage. A middle ear effusion is present.     Nose: Rhinorrhea present. Rhinorrhea is clear.     Right Sinus: No maxillary sinus tenderness or frontal sinus tenderness.     Left Sinus: No maxillary sinus tenderness or frontal sinus tenderness.     Mouth/Throat:     Mouth: Mucous membranes are moist.     Pharynx: Posterior oropharyngeal erythema (cobblestone pattern) present. No pharyngeal swelling or oropharyngeal exudate.     Tonsils: No tonsillar exudate.  Eyes:     General: Lids are normal.        Right eye: No discharge.        Left eye: No discharge.     Conjunctiva/sclera: Conjunctivae normal.     Pupils: Pupils are equal, round, and reactive to light.  Neck:     Thyroid: No thyromegaly.     Vascular: No carotid bruit or JVD.  Cardiovascular:     Rate and Rhythm: Normal rate and regular rhythm.     Pulses:          Dorsalis pedis pulses are 2+ on the right side and 2+ on the left side.       Posterior tibial pulses are 2+ on the right side and 2+ on the left side.     Heart sounds: Normal heart sounds. No murmur heard. No gallop.   Pulmonary:     Effort: Pulmonary effort is normal.     Breath sounds: Normal breath sounds.  Abdominal:     General: Bowel sounds are normal.     Palpations: Abdomen is soft.  Genitourinary:    Exam position: Lithotomy position.     Labia:        Right: No rash.        Left: No rash.     Musculoskeletal:     Cervical back: Normal range of motion and neck supple.     Right lower leg: No edema.     Left lower leg: No edema.  Feet:     Right foot:     Toenail Condition: Right toenails are abnormally thick.     Left foot:     Toenail Condition: Left toenails are normal.     Comments: Small, 1 cm, plantar wart noted to left posterior foot. Lymphadenopathy:     Cervical: No cervical adenopathy.  Skin:    General: Skin is warm and dry.  Neurological:     Mental Status: She is alert and oriented to person, place, and time.  Psychiatric:         Attention and Perception: Attention normal.        Mood and Affect: Mood normal.        Speech: Speech normal.        Behavior: Behavior normal. Behavior is cooperative.        Thought Content:  Thought content normal.     Results for orders placed or performed in visit on 12/16/19  Hepatitis C antibody  Result Value Ref Range   Hep C Virus Ab <0.1 0.0 - 0.9 s/co ratio  Comprehensive metabolic panel  Result Value Ref Range   Glucose 85 65 - 99 mg/dL   BUN 17 6 - 20 mg/dL   Creatinine, Ser 3.38 (H) 0.57 - 1.00 mg/dL   GFR calc non Af Amer 69 >59 mL/min/1.73   GFR calc Af Amer 80 >59 mL/min/1.73   BUN/Creatinine Ratio 17 9 - 23   Sodium 139 134 - 144 mmol/L   Potassium 4.2 3.5 - 5.2 mmol/L   Chloride 104 96 - 106 mmol/L   CO2 21 20 - 29 mmol/L   Calcium 9.2 8.7 - 10.2 mg/dL   Total Protein 6.6 6.0 - 8.5 g/dL   Albumin 3.7 (L) 3.8 - 4.8 g/dL   Globulin, Total 2.9 1.5 - 4.5 g/dL   Albumin/Globulin Ratio 1.3 1.2 - 2.2   Bilirubin Total 0.3 0.0 - 1.2 mg/dL   Alkaline Phosphatase 119 48 - 121 IU/L   AST 20 0 - 40 IU/L   ALT 27 0 - 32 IU/L  CBC with Differential/Platelet  Result Value Ref Range   WBC 12.8 (H) 3.4 - 10.8 x10E3/uL   RBC 5.33 (H) 3.77 - 5.28 x10E6/uL   Hemoglobin 13.8 11.1 - 15.9 g/dL   Hematocrit 25.0 53.9 - 46.6 %   MCV 83 79 - 97 fL   MCH 25.9 (L) 26.6 - 33.0 pg   MCHC 31.3 (L) 31.5 - 35.7 g/dL   RDW 76.7 34.1 - 93.7 %   Platelets 444 150 - 450 x10E3/uL   Neutrophils 69 Not Estab. %   Lymphs 19 Not Estab. %   Monocytes 8 Not Estab. %   Eos 2 Not Estab. %   Basos 1 Not Estab. %   Neutrophils Absolute 8.9 (H) 1.4 - 7.0 x10E3/uL   Lymphocytes Absolute 2.4 0.7 - 3.1 x10E3/uL   Monocytes Absolute 1.1 (H) 0.1 - 0.9 x10E3/uL   EOS (ABSOLUTE) 0.3 0.0 - 0.4 x10E3/uL   Basophils Absolute 0.1 0.0 - 0.2 x10E3/uL   Immature Granulocytes 1 Not Estab. %   Immature Grans (Abs) 0.1 0.0 - 0.1 x10E3/uL  Lipid Panel w/o Chol/HDL Ratio  Result Value Ref Range    Cholesterol, Total 160 100 - 199 mg/dL   Triglycerides 76 0 - 149 mg/dL   HDL 44 >90 mg/dL   VLDL Cholesterol Cal 15 5 - 40 mg/dL   LDL Chol Calc (NIH) 240 (H) 0 - 99 mg/dL  TSH  Result Value Ref Range   TSH 2.050 0.450 - 4.500 uIU/mL  VITAMIN D 25 Hydroxy (Vit-D Deficiency, Fractures)  Result Value Ref Range   Vit D, 25-Hydroxy 25.1 (L) 30.0 - 100.0 ng/mL  Iron, TIBC and Ferritin Panel  Result Value Ref Range   Total Iron Binding Capacity 297 250 - 450 ug/dL   UIBC 973 532 - 992 ug/dL   Iron 32 27 - 426 ug/dL   Iron Saturation 11 (L) 15 - 55 %   Ferritin 108 15 - 150 ng/mL  T4, free  Result Value Ref Range   Free T4 1.53 0.82 - 1.77 ng/dL      Assessment & Plan:   Problem List Items Addressed This Visit      Musculoskeletal and Integument   Onychomycosis    To right foot, mainly 1st and 2nd  toenails. Will trial Penlac therapy and referral to podiatry.  Recommend keeping feet dry and discussed fungal disease.      Relevant Medications   ciclopirox (PENLAC) 8 % solution   Other Relevant Orders   Ambulatory referral to Podiatry   Plantar wart of left foot    Current to left foot, with history of same.  Referral to podiatry.      Relevant Medications   ciclopirox (PENLAC) 8 % solution     Genitourinary   Labial cyst    To left lower labia, improving, smaller.  Recommend warm compresses 3 times a day to area.  Will send in Doxycycline 100 MG BID x 7 days to prevent infection, area mildly red and firm.  Return in 4 weeks for follow-up, sooner if worsening then would consider GYN.        Other   Morbid obesity (HCC) - Primary    BMI 65.25.  Recommended eating smaller high protein, low fat meals more frequently and exercising 30 mins a day 5 times a week with a goal of 10-15lb weight loss in the next 3 months. Patient voiced their understanding and motivation to adhere to these recommendations.       Sinus pressure    Chronic underlying allergic rhinitis with current  acute flare.  Will send in Prednisone 40 MG daily x 5 days and Claritin to start taking 10 MG daily.  Discussed with patient, will have return in 4 weeks for follow-up.  Could consider addition of Singulair in future if poor response to OTC methods.          Follow up plan: Return in about 4 weeks (around 09/15/2020) for Labial cyst and sinus pressure.

## 2020-08-26 ENCOUNTER — Ambulatory Visit: Payer: Self-pay | Admitting: Podiatry

## 2020-09-02 ENCOUNTER — Other Ambulatory Visit: Payer: Self-pay

## 2020-09-02 ENCOUNTER — Ambulatory Visit (INDEPENDENT_AMBULATORY_CARE_PROVIDER_SITE_OTHER): Payer: 59 | Admitting: Podiatry

## 2020-09-02 DIAGNOSIS — B070 Plantar wart: Secondary | ICD-10-CM

## 2020-09-06 NOTE — Progress Notes (Signed)
  Subjective:  Patient ID: Amy Larsen, female    DOB: [birth year],  MRN: [id number]  Chief Complaint  Patient presents with  .  Skin problem      NP -plantars wart present for 2 years getting bigger and over-the-counter medications not working    40 y.o. female presents with the above complaint. History confirmed with patient.   Objective:  Physical Exam: warm, good capillary refill, no trophic changes or ulcerative lesions, normal DP and PT pulses and normal sensory exam. Left Foot: Plantar foot plantar wart  Assessment:  No diagnosis found.   Plan:  Patient was evaluated and treated and all questions answered.  Discussed etiology and treatment of verruca plantaris in detail with the patient as well as multiple treatment options including blistering agents, chemotherapeutic agents, surgical excision, laser therapy and the indications and roles of the above.  Today, recommended treatment with Cantharone as noted in procedure note below.  Follow-up in 3 weeks for reevaluation  Procedure: Destruction of Lesion Location: Left plantar midfoot Instrumentation: 15 blade. Technique: Debridement of lesion to petechial bleeding. Aperture pad applied around lesion. Small amount of canthrone applied to the base of the lesion. Dressing: Dry, sterile, compression dressing. Disposition: Patient tolerated procedure well. Advised to leave dressing on for 6-8 hours. Thereafter patient to wash the area with soap and water and applied band-aid. Off-loading pads dispensed. Patient to return in 3 weeks for follow-up.   Return in about 3 weeks (around 09/23/2020) for to check on wart.

## 2020-09-17 ENCOUNTER — Ambulatory Visit: Payer: 59 | Admitting: Nurse Practitioner

## 2020-09-28 ENCOUNTER — Other Ambulatory Visit: Payer: Self-pay

## 2020-09-28 ENCOUNTER — Ambulatory Visit (INDEPENDENT_AMBULATORY_CARE_PROVIDER_SITE_OTHER): Payer: 59 | Admitting: Podiatry

## 2020-09-28 ENCOUNTER — Encounter: Payer: Self-pay | Admitting: Podiatry

## 2020-09-28 DIAGNOSIS — L603 Nail dystrophy: Secondary | ICD-10-CM | POA: Diagnosis not present

## 2020-09-28 DIAGNOSIS — B070 Plantar wart: Secondary | ICD-10-CM

## 2020-09-28 MED ORDER — CICLOPIROX 8 % EX SOLN: Freq: Every day | CUTANEOUS | 3 refills | Status: DC

## 2020-09-28 NOTE — Progress Notes (Signed)
  Subjective:  Patient ID: Amy Larsen, female    DOB: 1981-04-05,  MRN: [id number]  Chief Complaint  Patient presents with    Skin problem      NP -plantars wart present for 2 years getting bigger and over-the-counter medications not working    40 y.o. female presents with the above complaint. History confirmed with patient.   Objective:  Physical Exam: warm, good capillary refill, no trophic changes or ulcerative lesions, normal DP and PT pulses and normal sensory exam. Left Foot: Plantar foot mid arch no wart remains after debridement Right foot: Second toenail dystrophic with yellow-brown discoloration and subungual debris  Assessment:   1. Nail dystrophy   2. Verruca plantaris      Plan:  Patient was evaluated and treated and all questions answered.  Wart appears to be under good control now after 1 application of Cantharone.  She will keep an eye on this area if becomes painful or recurrent will return for further application  -She has a dystrophic right hallux second toenail.  I think is likely onychomycosis.  Culture of the nail was taken today to be sent to [facility] pathology.  Refilled her ciclopirox.  Return as needed for further treatment with oral medications if necessary   Return if symptoms worsen or fail to improve.

## 2020-10-08 ENCOUNTER — Encounter: Payer: Self-pay | Admitting: *Deleted

## 2020-12-14 ENCOUNTER — Encounter: Payer: 59 | Admitting: Nurse Practitioner

## 2021-01-17 ENCOUNTER — Telehealth: Payer: Self-pay | Admitting: Emergency Medicine

## 2021-01-17 ENCOUNTER — Emergency Department
Admission: EM | Admit: 2021-01-17 | Discharge: 2021-01-17 | Disposition: A | Discharge status: Home / Self Care | Payer: 59 | Attending: Emergency Medicine | Admitting: Emergency Medicine

## 2021-01-17 ENCOUNTER — Encounter: Payer: Self-pay | Admitting: Emergency Medicine

## 2021-01-17 ENCOUNTER — Other Ambulatory Visit: Payer: Self-pay

## 2021-01-17 DIAGNOSIS — H9201 Otalgia, right ear: Secondary | ICD-10-CM | POA: Insufficient documentation

## 2021-01-17 DIAGNOSIS — E039 Hypothyroidism, unspecified: Secondary | ICD-10-CM | POA: Diagnosis not present

## 2021-01-17 DIAGNOSIS — Z79899 Other long term (current) drug therapy: Secondary | ICD-10-CM | POA: Diagnosis not present

## 2021-01-17 DIAGNOSIS — U071 COVID-19: Secondary | ICD-10-CM | POA: Diagnosis not present

## 2021-01-17 DIAGNOSIS — J069 Acute upper respiratory infection, unspecified: Secondary | ICD-10-CM

## 2021-01-17 DIAGNOSIS — R059 Cough, unspecified: Secondary | ICD-10-CM | POA: Diagnosis present

## 2021-01-17 DIAGNOSIS — Z87891 Personal history of nicotine dependence: Secondary | ICD-10-CM | POA: Diagnosis not present

## 2021-01-17 LAB — RESP PANEL BY RT-PCR (FLU A&B, COVID) ARPGX2
Influenza A by PCR: NEGATIVE
Influenza B by PCR: NEGATIVE
SARS Coronavirus 2 by RT PCR: POSITIVE — AB

## 2021-01-17 MED ORDER — PSEUDOEPH-BROMPHEN-DM 30-2-10 MG/5ML PO SYRP: 5.0000 mL | ORAL_SOLUTION | Freq: Four times a day (QID) | ORAL | 0 refills | Status: DC | PRN

## 2021-01-17 NOTE — Discharge Instructions (Signed)
Take the prescription med as directed. Follow your Covid/Flu tests on [facility] MyChart.

## 2021-01-17 NOTE — ED Triage Notes (Signed)
Pt reports cough, congestion and right ear pain for several days. Reports intermittent fevers as well.

## 2021-01-18 NOTE — ED Provider Notes (Signed)
[facility] Emergency Department Provider Note ____________________________________________  Time seen: 1224  I have reviewed the triage vital signs and the nursing notes.  HISTORY  Chief Complaint  Otalgia and Sore Throat  HPI Amy Larsen is a 40 y.o. female Amy Larsen to the ED with complaints of cough, congestion, right ear pain for several days.  She also noted some intermittent fevers.  She presents with family members to also be evaluated for similar complaints.  Past Medical History:  Diagnosis Date   Allergy    Anxiety    Arthritis    lumbar spine   Blood transfusion without reported diagnosis    Depression    Eczema    Edema    Low vitamin D level     Patient Active Problem List   Diagnosis Date Noted   Sinus pressure 08/18/2020   Labial cyst 08/18/2020   Onychomycosis 08/18/2020   Plantar wart of left foot 08/18/2020   Arthritis of left hip 09/24/2019   Acne 04/26/2019   Insomnia 04/26/2019   IFG (impaired fasting glucose) 03/29/2019   Vitamin D deficiency 02/21/2019   PTSD (post-traumatic stress disorder) 02/21/2019   Chronic pain syndrome 02/21/2019   Morbid obesity (HCC) 10/11/2018   Hypothyroidism 10/11/2018   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 Authorizing Provider  brompheniramine-pseudoephedrine-DM 30-2-10 MG/5ML syrup Take 5 mLs by mouth 4 (four) times daily as needed. 01/17/21  Yes Alleen Kehm, Charlesetta Ivory, PA-C  Cholecalciferol (VITAMIN D3 GUMMIES PO) Take 5,000 Units by mouth daily.    [provider]  ciclopirox (PENLAC) 8 % solution Apply topically at bedtime. Apply over nail and surrounding skin. Apply daily over previous coat. After seven (7) days, may remove with  alcohol and continue cycle. 09/28/20   McDonald, Rachelle Hora, DPM  clindamycin-benzoyl peroxide (BENZACLIN) gel Apply topically 2 (two) times daily. 03/29/19   Cannady, Corrie Dandy T, NP  furosemide (LASIX) 40 MG tablet Take 1 tablet (40 mg total) by mouth every other day. 07/14/20   Cannady, Corrie Dandy T, NP  hydrocortisone 2.5 % ointment Apply topically 2 (two) times daily.    [provider]  levothyroxine (SYNTHROID) 88 MCG tablet Take 1 tablet (88 mcg total) by mouth daily before breakfast. 07/14/20   Cannady, Corrie Dandy T, NP  loratadine (CLARITIN) 10 MG tablet Take 1 tablet (10 mg total) by mouth daily. 08/18/20   Cannady, Corrie Dandy T, NP  triamcinolone ointment (KENALOG) 0.1 % Apply 1 application topically 2 (two) times daily.    [provider]    Allergies Codeine and Gabapentin  Family History  Problem Relation Age of Onset   Anxiety disorder Mother    Depression Mother    Insomnia Mother    Rheum arthritis Mother    Congenital heart disease Mother    Diabetes Mother    Hypotension Maternal Grandmother    Cancer Maternal Grandmother    Congenital heart disease Maternal Grandfather    Rheum arthritis Maternal Grandfather     Social History Social History   Tobacco Use   Smoking status: Former    Types: E-cigarettes   Smokeless tobacco: Never  Vaping Use   Vaping Use: Every day  Substance Use Topics  Alcohol use: Yes    Comment: very rare   Drug use: Never    Review of Systems  Constitutional: Positive for intermittent fevers. Eyes: Negative for visual changes. ENT: Negative for sore throat.  Reports right ear pain and nasal congestion. Cardiovascular: Negative for chest pain. Respiratory: Negative for shortness of breath. Gastrointestinal: Negative for abdominal pain, vomiting and diarrhea. Genitourinary: Negative for dysuria. Musculoskeletal: Negative for back pain. Skin: Negative for rash. Neurological: Negative for headaches, focal weakness or  numbness. ____________________________________________  PHYSICAL EXAM:  VITAL SIGNS: ED Triage Vitals  Enc Vitals Group     BP 01/17/21 1143 (!) 144/90     Pulse Rate 01/17/21 1143 82     Resp 01/17/21 1143 18     Temp 01/17/21 1143 98.6 F (37 C)     Temp Source 01/17/21 1143 Oral     SpO2 01/17/21 1143 99 %     Weight 01/17/21 1050 (!) 412 lb 4.2 oz (187 kg)     Height 01/17/21 1050 5\' 6"  (1.676 m)     Head Circumference --      Peak Flow --      Pain Score 01/17/21 1050 6     Pain Loc --      Pain Edu? --      Excl. in GC? --     Constitutional: Alert and oriented. Well appearing and in no distress. Head: Normocephalic and atraumatic. Eyes: Conjunctivae are normal. Normal extraocular movements Ears: Canals clear. TMs intact bilaterally. Nose: No congestion/rhinorrhea/epistaxis. Mouth/Throat: Mucous membranes are moist. Neck: Supple. No thyromegaly. Hematological/Lymphatic/Immunological: No cervical lymphadenopathy. Cardiovascular: Normal rate, regular rhythm. Normal distal pulses. Respiratory: Normal respiratory effort. No wheezes/rales/rhonchi. Gastrointestinal: Soft and nontender. No distention. Musculoskeletal: Nontender with normal range of motion in all extremities.  Neurologic:  Normal gait without ataxia. Normal speech and language. No gross focal neurologic deficits are appreciated. Skin:  Skin is warm, dry and intact. No rash noted. Psychiatric: Mood and affect are normal. Patient exhibits appropriate insight and judgment. ____________________________________________    {LABS (pertinent positives/negatives) Labs Reviewed  RESP PANEL BY RT-PCR (FLU A&B, COVID) ARPGX2 - Abnormal; Notable for the following components:      Result Value   SARS Coronavirus 2 by RT PCR POSITIVE (*)    All other components within normal limits  ____________________________________________  {EKG  ____________________________________________   RADIOLOGY Official radiology  report(s): No results found. ____________________________________________  PROCEDURES   Procedures ____________________________________________   INITIAL IMPRESSION / ASSESSMENT AND PLAN / ED COURSE  As part of my medical decision making, I reviewed the following data within the electronic MEDICAL RECORD NUMBER Labs reviewed pending and Notes from prior ED visits   Patient ED evaluation of symptoms concerning for viral etiology including COVID at this time.  Patient is evaluated for symptoms and found to have stable exam without signs of acute respiratory to dehydration.  She will be treated with a prescription for Bromfed syrup, and continue take over-the-counter medications including allergy medicines as needed.  She will follow with primary provider and follow her test results on [facility] MyChart.  Amy Larsen was evaluated in Emergency Department on 01/18/2021 for the symptoms described in the history of present illness. She was evaluated in the context of the global COVID-19 pandemic, which necessitated consideration that the patient might be at risk for infection with the SARS-CoV-2 virus that causes COVID-19. Institutional protocols and algorithms that pertain to the evaluation of patients at risk for COVID-19 are in a state  of rapid change based on information released by regulatory bodies including the CDC and federal and state organizations. These policies and algorithms were followed during the patient's care in the ED. ____________________________________________  FINAL CLINICAL IMPRESSION(S) / ED DIAGNOSES  Final diagnoses:  Viral URI with cough      Marrietta Thunder, Charlesetta Ivory, PA-C 01/18/21 2037    Georga Hacking, MD 01/23/21 [phone]

## 2021-01-19 ENCOUNTER — Ambulatory Visit: Payer: 59 | Admitting: Nurse Practitioner

## 2021-01-23 ENCOUNTER — Encounter: Payer: Self-pay | Admitting: Nurse Practitioner

## 2021-01-23 DIAGNOSIS — E7800 Pure hypercholesterolemia, unspecified: Secondary | ICD-10-CM | POA: Insufficient documentation

## 2021-01-29 ENCOUNTER — Other Ambulatory Visit: Payer: Self-pay

## 2021-01-29 ENCOUNTER — Encounter: Payer: Self-pay | Admitting: Nurse Practitioner

## 2021-01-29 ENCOUNTER — Ambulatory Visit (INDEPENDENT_AMBULATORY_CARE_PROVIDER_SITE_OTHER): Payer: 59 | Admitting: Nurse Practitioner

## 2021-01-29 DIAGNOSIS — B351 Tinea unguium: Secondary | ICD-10-CM

## 2021-01-29 DIAGNOSIS — L309 Dermatitis, unspecified: Secondary | ICD-10-CM | POA: Insufficient documentation

## 2021-01-29 DIAGNOSIS — E039 Hypothyroidism, unspecified: Secondary | ICD-10-CM | POA: Diagnosis not present

## 2021-01-29 DIAGNOSIS — R7301 Impaired fasting glucose: Secondary | ICD-10-CM | POA: Diagnosis not present

## 2021-01-29 DIAGNOSIS — E559 Vitamin D deficiency, unspecified: Secondary | ICD-10-CM

## 2021-01-29 DIAGNOSIS — Z23 Encounter for immunization: Secondary | ICD-10-CM

## 2021-01-29 DIAGNOSIS — E7800 Pure hypercholesterolemia, unspecified: Secondary | ICD-10-CM

## 2021-01-29 DIAGNOSIS — L2084 Intrinsic (allergic) eczema: Secondary | ICD-10-CM

## 2021-01-29 MED ORDER — CICLOPIROX 8 % EX SOLN: Freq: Every day | CUTANEOUS | 3 refills | Status: DC

## 2021-01-29 MED ORDER — TRIAMCINOLONE ACETONIDE 0.1 % EX OINT: 1.0000 "application " | TOPICAL_OINTMENT | Freq: Two times a day (BID) | CUTANEOUS | 4 refills | Status: AC

## 2021-01-29 MED ORDER — HYDROCORTISONE 2.5 % EX OINT: TOPICAL_OINTMENT | Freq: Two times a day (BID) | CUTANEOUS | 2 refills | Status: DC

## 2021-01-29 NOTE — Progress Notes (Signed)
BP 114/74   Pulse 90   Temp 98.7 F (37.1 C) (Oral)   Wt (!) 391 lb 12.8 oz (177.7 kg)   LMP 01/08/2021 (Approximate)   SpO2 98%   BMI 63.24 kg/m    Subjective:    Patient ID: Amy Larsen, female    DOB: [birth year], 40 y.o.   MRN: [id number]  HPI: Amy Larsen is a 40 y.o. female  Chief Complaint  Patient presents with   Hypothyroidism   Vitamin D   Nail Problem    Patient states she would like to discuss issues with her toenail again as the medication that was prescribed from provider and foot doctor was too expensive and insurance would not cover it. Patient states whatever is on the one toenail looks like it is spread to the big toenail.    Medication Refill    Patient is requesting refills on her cream and gel for her Eczema and other skin conditions. Patient states they are from previous dermatologist but was wondering if provider could send them in for her.    Would like refills on eczema medications sent in.  NAIL ISSUES: Continues to have issues with fungal disease to nails, current treatments were too costly and podiatry ordered medication that is too costly.    HYPOTHYROIDISM Currently taking Levothyroxine 88 MCG daily.  Has been on medication since July 2019.  At physical labs in 2021 was noted to have mild elevation in LDL and A1c at 5.7%. Thyroid control status:stable Satisfied with current treatment? yes Medication side effects: no Medication compliance: good compliance Etiology of hypothyroidism:  Recent dose adjustment:no Fatigue: no Cold intolerance: yes Heat intolerance: no Weight gain: no Weight loss: no Constipation: yes, occasional Diarrhea/loose stools: yes , occasional Palpitations: no Lower extremity edema: yes, started after she had her son 17 years ago.  Wears TED hose daily + avoids salt.   Anxiety/depressed mood: occasional    LOW VITAMIN D LEVEL: Last level 25.1.  Is taking Vitamin D 5000 units gummy.  Reports she did at one  time take weekly Vit D.  Denies recent falls or fractures.  Relevant past medical, surgical, family and social history reviewed and updated as indicated. Interim medical history since our last visit reviewed. Allergies and medications reviewed and updated.  Review of Systems  Constitutional:  Negative for activity change, appetite change, diaphoresis, fatigue and fever.  Respiratory:  Negative for cough, chest tightness and shortness of breath.   Cardiovascular:  Negative for chest pain, palpitations and leg swelling.  Endocrine: Negative for cold intolerance, heat intolerance, polydipsia, polyphagia and polyuria.  Neurological: Negative.   Psychiatric/Behavioral: Negative.     Per HPI unless specifically indicated above     Objective:    BP 114/74   Pulse 90   Temp 98.7 F (37.1 C) (Oral)   Wt (!) 391 lb 12.8 oz (177.7 kg)   LMP 01/08/2021 (Approximate)   SpO2 98%   BMI 63.24 kg/m   Wt Readings from Last 3 Encounters:  01/29/21 (!) 391 lb 12.8 oz (177.7 kg)  01/17/21 (!) 412 lb 4.2 oz (187 kg)  08/18/20 (!) 410 lb 6.4 oz (186.2 kg)    Physical Exam Vitals and nursing note reviewed.  Constitutional:      General: She is awake. She is not in acute distress.    Appearance: She is well-developed and well-groomed. She is morbidly obese. She is not ill-appearing.  HENT:     Head: Normocephalic.  Right Ear: Hearing normal.     Left Ear: Hearing normal.  Eyes:     General: Lids are normal.        Right eye: No discharge.        Left eye: No discharge.     Conjunctiva/sclera: Conjunctivae normal.     Pupils: Pupils are equal, round, and reactive to light.  Neck:     Thyroid: No thyromegaly.     Vascular: No carotid bruit or JVD.  Cardiovascular:     Rate and Rhythm: Normal rate and regular rhythm.     Heart sounds: Normal heart sounds. No murmur heard.   No gallop.  Pulmonary:     Effort: Pulmonary effort is normal.     Breath sounds: Normal breath sounds.   Abdominal:     General: Bowel sounds are normal.     Palpations: Abdomen is soft.  Musculoskeletal:     Cervical back: Normal range of motion and neck supple.     Right lower leg: No edema.     Left lower leg: No edema.  Lymphadenopathy:     Cervical: No cervical adenopathy.  Skin:    General: Skin is warm and dry.  Neurological:     Mental Status: She is alert and oriented to person, place, and time.  Psychiatric:        Attention and Perception: Attention normal.        Mood and Affect: Mood normal.        Speech: Speech normal.        Behavior: Behavior normal. Behavior is cooperative.        Thought Content: Thought content normal.   Results for orders placed or performed during the hospital encounter of 01/17/21  Resp Panel by RT-PCR (Flu A&B, Covid) Nasopharyngeal Swab   Specimen: Nasopharyngeal Swab; Nasopharyngeal(NP) swabs in vial transport medium  Result Value Ref Range   SARS Coronavirus 2 by RT PCR POSITIVE (A) NEGATIVE   Influenza A by PCR NEGATIVE NEGATIVE   Influenza B by PCR NEGATIVE NEGATIVE      Assessment & Plan:   Problem List Items Addressed This Visit       Endocrine   Hypothyroidism    Chronic, ongoing.  Continue current medication regimen and adjust as needed.  Thyroid labs today.      Relevant Orders   T4, free   Thyroid peroxidase antibody   TSH   IFG (impaired fasting glucose)    Recheck A1C today and adjust regimen as needed.      Relevant Orders   Comprehensive metabolic panel   HgB A1c     Musculoskeletal and Integument   Onychomycosis    To right foot, mainly 1st and 2nd toenails. Will trial Penlac therapy and provided her a GoodRx link to use for coupon.      Relevant Medications   ciclopirox (PENLAC) 8 % solution   Eczema    Refills sent in on medications.        Other   Morbid obesity (HCC) - Primary    BMI 63.24.  Recommended eating smaller high protein, low fat meals more frequently and exercising 30 mins a day 5  times a week with a goal of 10-15lb weight loss in the next 3 months. Patient voiced their understanding and motivation to adhere to these recommendations.       Vitamin D deficiency    Ongoing, stable.  Recheck Vit D level today and adjust regimen as needed.  Relevant Orders   VITAMIN D 25 Hydroxy (Vit-D Deficiency, Fractures)   Elevated low density lipoprotein (LDL) cholesterol level    Noted on past labs -- recheck levels today.  Continue focus on weight and diet regimen.      Relevant Orders   CBC with Differential/Platelet   Comprehensive metabolic panel   Lipid Panel w/o Chol/HDL Ratio   Other Visit Diagnoses     Flu vaccine need            Follow up plan: Return in about 6 months (around 07/30/2021) for THYROID, PAIN, MOOD.

## 2021-01-29 NOTE — Assessment & Plan Note (Signed)
Recheck A1C today and adjust regimen as needed. 

## 2021-01-29 NOTE — Patient Instructions (Signed)

## 2021-01-29 NOTE — Assessment & Plan Note (Signed)
Refills sent in on medications  

## 2021-01-29 NOTE — Assessment & Plan Note (Addendum)
Ongoing, stable.  Recheck Vit D level today and adjust regimen as needed. 

## 2021-01-29 NOTE — Assessment & Plan Note (Signed)
BMI 63.24.  Recommended eating smaller high protein, low fat meals more frequently and exercising 30 mins a day 5 times a week with a goal of 10-15lb weight loss in the next 3 months. Patient voiced their understanding and motivation to adhere to these recommendations.

## 2021-01-29 NOTE — Assessment & Plan Note (Signed)
To right foot, mainly 1st and 2nd toenails. Will trial Penlac therapy and provided her a GoodRx link to use for coupon.

## 2021-01-29 NOTE — Assessment & Plan Note (Signed)
Chronic, ongoing.  Continue current medication regimen and adjust as needed.  Thyroid labs today. 

## 2021-01-29 NOTE — Assessment & Plan Note (Signed)
Noted on past labs -- recheck levels today.  Continue focus on weight and diet regimen.

## 2021-01-30 LAB — CBC WITH DIFFERENTIAL/PLATELET
Basophils Absolute: 0.1 10*3/uL (ref 0.0–0.2)
Basos: 1 %
EOS (ABSOLUTE): 0.2 10*3/uL (ref 0.0–0.4)
Eos: 2 %
Hematocrit: 41.8 % (ref 34.0–46.6)
Hemoglobin: 13.7 g/dL (ref 11.1–15.9)
Immature Grans (Abs): 0.1 10*3/uL (ref 0.0–0.1)
Immature Granulocytes: 1 %
Lymphocytes Absolute: 2.6 10*3/uL (ref 0.7–3.1)
Lymphs: 26 %
MCH: 27.7 pg (ref 26.6–33.0)
MCHC: 32.8 g/dL (ref 31.5–35.7)
MCV: 85 fL (ref 79–97)
Monocytes Absolute: 1 10*3/uL — ABNORMAL HIGH (ref 0.1–0.9)
Monocytes: 9 %
Neutrophils Absolute: 6.4 10*3/uL (ref 1.4–7.0)
Neutrophils: 61 %
Platelets: 423 10*3/uL (ref 150–450)
RBC: 4.94 x10E6/uL (ref 3.77–5.28)
RDW: 13.6 % (ref 11.7–15.4)
WBC: 10.2 10*3/uL (ref 3.4–10.8)

## 2021-01-30 LAB — COMPREHENSIVE METABOLIC PANEL
ALT: 28 IU/L (ref 0–32)
AST: 20 IU/L (ref 0–40)
Albumin/Globulin Ratio: 1.5 (ref 1.2–2.2)
Albumin: 3.8 g/dL (ref 3.8–4.8)
Alkaline Phosphatase: 127 IU/L — ABNORMAL HIGH (ref 44–121)
BUN/Creatinine Ratio: 21 (ref 9–23)
BUN: 16 mg/dL (ref 6–24)
Bilirubin Total: 0.4 mg/dL (ref 0.0–1.2)
CO2: 20 mmol/L (ref 20–29)
Calcium: 9.5 mg/dL (ref 8.7–10.2)
Chloride: 102 mmol/L (ref 96–106)
Creatinine, Ser: 0.78 mg/dL (ref 0.57–1.00)
Globulin, Total: 2.6 g/dL (ref 1.5–4.5)
Glucose: 98 mg/dL (ref 70–99)
Potassium: 4.5 mmol/L (ref 3.5–5.2)
Sodium: 141 mmol/L (ref 134–144)
Total Protein: 6.4 g/dL (ref 6.0–8.5)
eGFR: 98 mL/min/{1.73_m2} (ref >59)

## 2021-01-30 LAB — T4, FREE: Free T4: 1.42 ng/dL (ref 0.82–1.77)

## 2021-01-30 LAB — LIPID PANEL W/O CHOL/HDL RATIO
Cholesterol, Total: 173 mg/dL (ref 100–199)
HDL: 53 mg/dL (ref >39)
LDL Chol Calc (NIH): 102 mg/dL — ABNORMAL HIGH (ref 0–99)
Triglycerides: 96 mg/dL (ref 0–149)
VLDL Cholesterol Cal: 18 mg/dL (ref 5–40)

## 2021-01-30 LAB — THYROID PEROXIDASE ANTIBODY: Thyroperoxidase Ab SerPl-aCnc: 9 IU/mL (ref 0–34)

## 2021-01-30 LAB — HEMOGLOBIN A1C
Est. average glucose Bld gHb Est-mCnc: 117 mg/dL
Hgb A1c MFr Bld: 5.7 % — ABNORMAL HIGH (ref 4.8–5.6)

## 2021-01-30 LAB — VITAMIN D 25 HYDROXY (VIT D DEFICIENCY, FRACTURES): Vit D, 25-Hydroxy: 36.3 ng/mL (ref 30.0–100.0)

## 2021-01-30 LAB — TSH: TSH: 2.96 u[IU]/mL (ref 0.450–4.500)

## 2021-01-31 NOTE — Progress Notes (Signed)
Contacted via MyChart   Good afternoon Baili, your labs have returned and overall are remaining stable.  No medication changes needed.  LDL remains elevated, this is the bad cholesterol, continue focus on healthy diet. To reduce your LDL, Remember - more fruits and vegetables, more fish, and limit red meat and dairy products. More soy, nuts, beans, barley, lentils, oats and plant sterol ester enriched margarine instead of butter. I also encourage eliminating sugar and processed food. Remember, shop on the outside of the grocery store and visit your [employer]. If you would like to talk with me about dietary for your cholesterol, please let me know. We should recheck your cholesterol in 12 months.  Any questions? Keep being amazing!!  Thank you for allowing me to participate in your care.  I appreciate you. Kindest regards, Rechelle Niebla

## 2021-05-02 ENCOUNTER — Encounter: Payer: Self-pay | Admitting: Nurse Practitioner

## 2021-05-24 ENCOUNTER — Encounter: Payer: Self-pay | Admitting: Nurse Practitioner

## 2021-05-25 MED ORDER — LEVOTHYROXINE SODIUM 88 MCG PO TABS: 88.0000 ug | ORAL_TABLET | Freq: Every day | ORAL | 4 refills | Status: DC

## 2021-07-24 NOTE — Patient Instructions (Incomplete)

## 2021-07-30 ENCOUNTER — Ambulatory Visit: Payer: Self-pay | Admitting: Nurse Practitioner

## 2021-09-05 ENCOUNTER — Encounter: Payer: Self-pay | Admitting: Nurse Practitioner

## 2021-09-06 ENCOUNTER — Other Ambulatory Visit: Payer: Self-pay

## 2021-09-06 MED ORDER — LEVOTHYROXINE SODIUM 88 MCG PO TABS: 88.0000 ug | ORAL_TABLET | Freq: Every day | ORAL | 0 refills | Status: DC

## 2021-09-06 NOTE — Telephone Encounter (Signed)
Called patient to schedule an appt. Unable to leave vm due to full mailbox.

## 2021-09-06 NOTE — Telephone Encounter (Signed)
Patient was last seen in Oct. 2022, has not up coming appt.

## 2021-09-08 NOTE — Telephone Encounter (Signed)
Patient scheduled.

## 2021-09-25 NOTE — Patient Instructions (Signed)

## 2021-09-28 ENCOUNTER — Encounter: Payer: Self-pay | Admitting: Nurse Practitioner

## 2021-09-28 ENCOUNTER — Ambulatory Visit (INDEPENDENT_AMBULATORY_CARE_PROVIDER_SITE_OTHER): Payer: Self-pay | Admitting: Nurse Practitioner

## 2021-09-28 DIAGNOSIS — B351 Tinea unguium: Secondary | ICD-10-CM

## 2021-09-28 DIAGNOSIS — E7800 Pure hypercholesterolemia, unspecified: Secondary | ICD-10-CM

## 2021-09-28 DIAGNOSIS — M5442 Lumbago with sciatica, left side: Secondary | ICD-10-CM

## 2021-09-28 DIAGNOSIS — E559 Vitamin D deficiency, unspecified: Secondary | ICD-10-CM

## 2021-09-28 DIAGNOSIS — G894 Chronic pain syndrome: Secondary | ICD-10-CM

## 2021-09-28 DIAGNOSIS — R7301 Impaired fasting glucose: Secondary | ICD-10-CM

## 2021-09-28 DIAGNOSIS — F4310 Post-traumatic stress disorder, unspecified: Secondary | ICD-10-CM

## 2021-09-28 DIAGNOSIS — G8929 Other chronic pain: Secondary | ICD-10-CM

## 2021-09-28 DIAGNOSIS — E039 Hypothyroidism, unspecified: Secondary | ICD-10-CM

## 2021-09-28 MED ORDER — CLOTRIMAZOLE 1 % EX CREA: 1.0000 "application " | TOPICAL_CREAM | Freq: Two times a day (BID) | CUTANEOUS | 4 refills | Status: AC

## 2021-09-28 MED ORDER — FUROSEMIDE 40 MG PO TABS: 40.0000 mg | ORAL_TABLET | ORAL | 4 refills | Status: AC

## 2021-09-28 MED ORDER — LEVOTHYROXINE SODIUM 88 MCG PO TABS: 88.0000 ug | ORAL_TABLET | Freq: Every day | ORAL | 4 refills | Status: DC

## 2021-09-28 MED ORDER — LORATADINE 10 MG PO TABS: 10.0000 mg | ORAL_TABLET | Freq: Every day | ORAL | 4 refills | Status: AC

## 2021-09-28 NOTE — Assessment & Plan Note (Signed)
BMI 62.95 with some loss present.  Recommended eating smaller high protein, low fat meals more frequently and exercising 30 mins a day 5 times a week with a goal of 10-15lb weight loss in the next 3 months. Patient voiced their understanding and motivation to adhere to these recommendations.

## 2021-09-28 NOTE — Assessment & Plan Note (Signed)
Chronic, ongoing.  Did not have success with pain clinic and does not wish to return at this time.  Would benefit from pain evaluation and recommendations.  At this time continue at home regimen, including CBD. 

## 2021-09-28 NOTE — Assessment & Plan Note (Signed)
Refer to chronic low back pain plan of care.

## 2021-09-28 NOTE — Assessment & Plan Note (Signed)
Recheck A1C today and adjust regimen as needed.

## 2021-09-28 NOTE — Assessment & Plan Note (Signed)
Ongoing, stable.  Recheck Vit D level today and adjust regimen as needed.

## 2021-09-28 NOTE — Assessment & Plan Note (Signed)
Chronic, ongoing.  Stable without medication at this time.  Continue to monitor.

## 2021-09-28 NOTE — Assessment & Plan Note (Signed)
Chronic, ongoing.  No success with Ciprodex, try cream as currently without insurance and cost is concern.

## 2021-09-28 NOTE — Assessment & Plan Note (Signed)
Noted on past labs -- recheck levels today.  Continue focus on weight and diet regimen. 
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
## 2021-09-28 NOTE — Assessment & Plan Note (Signed)
Chronic, ongoing.  Continue current medication regimen and adjust as needed.  Thyroid labs today. 

## 2021-09-29 LAB — COMPREHENSIVE METABOLIC PANEL
ALT: 21 IU/L (ref 0–32)
AST: 15 IU/L (ref 0–40)
Albumin/Globulin Ratio: 1.4 (ref 1.2–2.2)
Albumin: 3.4 g/dL — ABNORMAL LOW (ref 3.8–4.8)
Alkaline Phosphatase: 103 IU/L (ref 44–121)
BUN/Creatinine Ratio: 18 (ref 9–23)
BUN: 14 mg/dL (ref 6–24)
Bilirubin Total: 0.3 mg/dL (ref 0.0–1.2)
CO2: 22 mmol/L (ref 20–29)
Calcium: 8.8 mg/dL (ref 8.7–10.2)
Chloride: 104 mmol/L (ref 96–106)
Creatinine, Ser: 0.79 mg/dL (ref 0.57–1.00)
Globulin, Total: 2.5 g/dL (ref 1.5–4.5)
Glucose: 95 mg/dL (ref 70–99)
Potassium: 4.1 mmol/L (ref 3.5–5.2)
Sodium: 140 mmol/L (ref 134–144)
Total Protein: 5.9 g/dL — ABNORMAL LOW (ref 6.0–8.5)
eGFR: 97 mL/min/{1.73_m2} (ref >59)

## 2021-09-29 LAB — CBC WITH DIFFERENTIAL/PLATELET
Basophils Absolute: 0.1 10*3/uL (ref 0.0–0.2)
Basos: 1 %
EOS (ABSOLUTE): 0.2 10*3/uL (ref 0.0–0.4)
Eos: 1 %
Hematocrit: 40.6 % (ref 34.0–46.6)
Hemoglobin: 13.5 g/dL (ref 11.1–15.9)
Immature Grans (Abs): 0 10*3/uL (ref 0.0–0.1)
Immature Granulocytes: 0 %
Lymphocytes Absolute: 2.9 10*3/uL (ref 0.7–3.1)
Lymphs: 28 %
MCH: 28.7 pg (ref 26.6–33.0)
MCHC: 33.3 g/dL (ref 31.5–35.7)
MCV: 86 fL (ref 79–97)
Monocytes Absolute: 0.9 10*3/uL (ref 0.1–0.9)
Monocytes: 8 %
Neutrophils Absolute: 6.4 10*3/uL (ref 1.4–7.0)
Neutrophils: 62 %
Platelets: 365 10*3/uL (ref 150–450)
RBC: 4.71 x10E6/uL (ref 3.77–5.28)
RDW: 13.7 % (ref 11.7–15.4)
WBC: 10.4 10*3/uL (ref 3.4–10.8)

## 2021-09-29 LAB — LIPID PANEL W/O CHOL/HDL RATIO
Cholesterol, Total: 163 mg/dL (ref 100–199)
HDL: 57 mg/dL (ref >39)
LDL Chol Calc (NIH): 89 mg/dL (ref 0–99)
Triglycerides: 93 mg/dL (ref 0–149)
VLDL Cholesterol Cal: 17 mg/dL (ref 5–40)

## 2021-09-29 LAB — T4, FREE: Free T4: 1.29 ng/dL (ref 0.82–1.77)

## 2021-09-29 LAB — HEMOGLOBIN A1C
Est. average glucose Bld gHb Est-mCnc: 111 mg/dL
Hgb A1c MFr Bld: 5.5 % (ref 4.8–5.6)

## 2021-09-29 LAB — VITAMIN D 25 HYDROXY (VIT D DEFICIENCY, FRACTURES): Vit D, 25-Hydroxy: 35.4 ng/mL (ref 30.0–100.0)

## 2021-09-29 LAB — TSH: TSH: 4.65 u[IU]/mL — ABNORMAL HIGH (ref 0.450–4.500)

## 2021-09-29 NOTE — Progress Notes (Signed)
Contacted via MyChart   Good morning Amy Larsen, good to see you and chat yesterday.  Your labs have returned: - Kidney function, creatinine and eGFR, remains normal, as is liver function, AST and ALT.   - Thyroid labs show mild elevation in TSH, but Free T4 normal.  Did you miss doses of Levothyroxine recently?  If so continue current dose and ensure to take daily.  If you have not missed doses let me know. - CBC shows no anemia or infection. - Vitamin D level in normal range. - A1c shows no diabetes or prediabetes!!!   - Since you do not have insurance at this time and we drew labs yesterday, look into Patient Financial Hardship letter and application via [employer] site.  If you can not find then come to office and we can give you.  It could save you some money.  Any questions? Keep being amazing!!  Thank you for allowing me to participate in your care.  I appreciate you. Kindest regards, Natarsha Hurwitz

## 2022-09-29 ENCOUNTER — Ambulatory Visit: Payer: Self-pay | Admitting: Nurse Practitioner

## 2022-11-02 ENCOUNTER — Ambulatory Visit: Payer: Self-pay | Admitting: Nurse Practitioner

## 2022-11-02 DIAGNOSIS — G894 Chronic pain syndrome: Secondary | ICD-10-CM

## 2022-11-02 DIAGNOSIS — R7301 Impaired fasting glucose: Secondary | ICD-10-CM

## 2022-11-02 DIAGNOSIS — E039 Hypothyroidism, unspecified: Secondary | ICD-10-CM

## 2022-11-02 DIAGNOSIS — R600 Localized edema: Secondary | ICD-10-CM

## 2022-11-02 DIAGNOSIS — Z124 Encounter for screening for malignant neoplasm of cervix: Secondary | ICD-10-CM

## 2022-11-02 DIAGNOSIS — F4310 Post-traumatic stress disorder, unspecified: Secondary | ICD-10-CM

## 2022-11-02 DIAGNOSIS — Z0000 Encounter for general adult medical examination without abnormal findings: Secondary | ICD-10-CM

## 2022-11-02 DIAGNOSIS — E7800 Pure hypercholesterolemia, unspecified: Secondary | ICD-10-CM

## 2022-11-02 DIAGNOSIS — E559 Vitamin D deficiency, unspecified: Secondary | ICD-10-CM

## 2022-11-02 DIAGNOSIS — Z1231 Encounter for screening mammogram for malignant neoplasm of breast: Secondary | ICD-10-CM

## 2023-03-12 NOTE — Patient Instructions (Signed)
Be Involved in Caring For Your Health:  Taking Medications When medications are taken as directed, they can greatly improve your health. But if they are not taken as prescribed, they may not work. In some cases, not taking them correctly can be harmful. To help ensure your treatment remains effective and safe, understand your medications and how to take them. Bring your medications to each visit for review by your provider.  Your lab results, notes, and after visit summary will be available on My Chart. We strongly encourage you to use this feature. If lab results are abnormal the clinic will contact you with the appropriate steps. If the clinic does not contact you assume the results are satisfactory. You can always view your results on My Chart. If you have questions regarding your health or results, please contact the clinic during office hours. You can also ask questions on My Chart.  We at [facility] are grateful that you chose [place] to provide your care. We strive to provide evidence-based and compassionate care and are always looking for feedback. If you get a survey from the clinic please complete this so we can hear your opinions.  Hypothyroidism  Hypothyroidism is when the thyroid gland does not make enough of certain hormones. This is called an underactive thyroid. The thyroid gland is a small gland located in the lower front part of the neck, just in front of the windpipe (trachea). This gland makes hormones that help control how the body uses food for energy (metabolism) as well as how the heart and brain function. These hormones also play a role in keeping your bones strong. When the thyroid is underactive, it produces too little of the hormones thyroxine (T4) and triiodothyronine (T3). What are the causes? This condition may be caused by: Hashimoto's disease. This is a disease in which the body's disease-fighting system (immune system) attacks the thyroid gland. This is the  most common cause. Viral infections. Pregnancy. Certain medicines. Birth defects. Problems with a gland in the center of the brain (pituitary gland). Lack of enough iodine in the diet. Other causes may include: Past radiation treatments to the head or neck for cancer. Past treatment with radioactive iodine. Past exposure to radiation in the environment. Past surgical removal of part or all of the thyroid. What increases the risk? You are more likely to develop this condition if: You are female. You have a family history of thyroid conditions. You use a medicine called lithium. You take medicines that affect the immune system (immunosuppressants). What are the signs or symptoms? Common symptoms of this condition include: Not being able to tolerate cold. Feeling as though you have no energy (lethargy). Lack of appetite. Constipation. Sadness or depression. Weight gain that is not explained by a change in diet or exercise habits. Menstrual irregularity. Dry skin, coarse hair, or brittle nails. Other symptoms may include: Muscle pain. Slowing of thought processes. Poor memory. How is this diagnosed? This condition may be diagnosed based on: Your symptoms, your medical history, and a physical exam. Blood tests. You may also have imaging tests, such as an ultrasound or MRI. How is this treated? This condition is treated with medicine that replaces the thyroid hormones that your body does not make. After you begin treatment, it may take several weeks for symptoms to go away. Follow these instructions at home: Take over-the-counter and prescription medicines only as told by your health care provider. If you start taking any new medicines, tell your health care  provider. Keep all follow-up visits as told by your health care provider. This is important. As your condition improves, your dosage of thyroid hormone medicine may change. You will need to have blood tests regularly so that  your health care provider can monitor your condition. Contact a health care provider if: Your symptoms do not get better with treatment. You are taking thyroid hormone replacement medicine and you: Sweat a lot. Have tremors. Feel anxious. Lose weight rapidly. Cannot tolerate heat. Have emotional swings. Have diarrhea. Feel weak. Get help right away if: You have chest pain. You have an irregular heartbeat. You have a rapid heartbeat. You have difficulty breathing. These symptoms may be an emergency. Get help right away. Call 911. Do not wait to see if the symptoms will go away. Do not drive yourself to the hospital. Summary Hypothyroidism is when the thyroid gland does not make enough of certain hormones (it is underactive). When the thyroid is underactive, it produces too little of the hormones thyroxine (T4) and triiodothyronine (T3). The most common cause is Hashimoto's disease, a disease in which the body's disease-fighting system (immune system) attacks the thyroid gland. The condition can also be caused by viral infections, medicine, pregnancy, or past radiation treatment to the head or neck. Symptoms may include weight gain, dry skin, constipation, feeling as though you do not have energy, and not being able to tolerate cold. This condition is treated with medicine to replace the thyroid hormones that your body does not make. This information is not intended to replace advice given to you by your health care provider. Make sure you discuss any questions you have with your health care provider. Document Revised: 04/06/2021 Document Reviewed: 04/06/2021 Elsevier Patient Education  2024 [employer].

## 2023-03-13 ENCOUNTER — Ambulatory Visit (INDEPENDENT_AMBULATORY_CARE_PROVIDER_SITE_OTHER): Payer: Self-pay | Admitting: Nurse Practitioner

## 2023-03-13 ENCOUNTER — Encounter: Payer: Self-pay | Admitting: Nurse Practitioner

## 2023-03-13 DIAGNOSIS — E039 Hypothyroidism, unspecified: Secondary | ICD-10-CM

## 2023-03-13 DIAGNOSIS — F4310 Post-traumatic stress disorder, unspecified: Secondary | ICD-10-CM

## 2023-03-13 DIAGNOSIS — F5104 Psychophysiologic insomnia: Secondary | ICD-10-CM

## 2023-03-13 DIAGNOSIS — E559 Vitamin D deficiency, unspecified: Secondary | ICD-10-CM

## 2023-03-13 DIAGNOSIS — Z975 Presence of (intrauterine) contraceptive device: Secondary | ICD-10-CM

## 2023-03-13 MED ORDER — LEVOTHYROXINE SODIUM 88 MCG PO TABS: 88.0000 ug | ORAL_TABLET | Freq: Every day | ORAL | 4 refills | Status: DC

## 2023-03-13 NOTE — Assessment & Plan Note (Signed)
Will get her scheduled with Dr. Evelene Croon for removal and replacement.

## 2023-03-13 NOTE — Assessment & Plan Note (Signed)
BMI 62.55 with some gain present.  Recommended eating smaller high protein, low fat meals more frequently and exercising 30 mins a day 5 times a week with a goal of 10-15lb weight loss in the next 3 months. Patient voiced their understanding and motivation to adhere to these recommendations.

## 2023-03-13 NOTE — Assessment & Plan Note (Signed)
Chronic, ongoing.  Continue CBD oil at home.  If worsening could consider Trazodone, which would benefit both mood and sleep pattern.

## 2023-03-13 NOTE — Assessment & Plan Note (Signed)
Chronic, ongoing.  Stable without medication at this time.  Continue to monitor.
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
## 2023-03-13 NOTE — Assessment & Plan Note (Addendum)
Chronic, ongoing.  Continue current medication regimen and adjust as needed.  Thyroid labs today.  Refills sent.

## 2023-03-13 NOTE — Assessment & Plan Note (Signed)
Ongoing, stable.  Recheck Vit D level future visit and adjust regimen as needed.

## 2023-03-14 ENCOUNTER — Other Ambulatory Visit: Payer: Self-pay | Admitting: Nurse Practitioner

## 2023-03-14 ENCOUNTER — Ambulatory Visit (INDEPENDENT_AMBULATORY_CARE_PROVIDER_SITE_OTHER): Payer: Self-pay | Admitting: Pediatrics

## 2023-03-14 ENCOUNTER — Encounter: Payer: Self-pay | Admitting: Pediatrics

## 2023-03-14 VITALS — BP 109/73 | HR 89 | Temp 98.6°F | Wt 395.0 lb

## 2023-03-14 DIAGNOSIS — E039 Hypothyroidism, unspecified: Secondary | ICD-10-CM

## 2023-03-14 DIAGNOSIS — Z1331 Encounter for screening for depression: Secondary | ICD-10-CM

## 2023-03-14 DIAGNOSIS — Z1330 Encounter for screening examination for mental health and behavioral disorders, unspecified: Secondary | ICD-10-CM

## 2023-03-14 DIAGNOSIS — Z975 Presence of (intrauterine) contraceptive device: Secondary | ICD-10-CM

## 2023-03-14 DIAGNOSIS — Z3009 Encounter for other general counseling and advice on contraception: Secondary | ICD-10-CM

## 2023-03-14 LAB — T4, FREE: Free T4: 1.06 ng/dL (ref 0.82–1.77)

## 2023-03-14 LAB — TSH: TSH: 4.52 u[IU]/mL — ABNORMAL HIGH (ref 0.450–4.500)

## 2023-03-14 MED ORDER — LEVOTHYROXINE SODIUM 100 MCG PO TABS: 100.0000 ug | ORAL_TABLET | Freq: Every day | ORAL | 3 refills | Status: DC

## 2023-03-14 NOTE — Progress Notes (Unsigned)
Office Visit  BP 109/73   Pulse 89   Temp 98.6 F (37 C) (Oral)   Wt (!) 395 lb (179.2 kg)   LMP 02/20/2023 (Approximate)   SpO2 98%   BMI 62.42 kg/m    Subjective:    Patient ID: Amy Larsen, female    DOB: 1981/01/22, 42 y.o.   MRN: [id number]  HPI: [name] is a 42 y.o. female  Chief Complaint  Patient presents with   Contraception    Discussed the use of AI scribe software for clinical note transcription with the patient, who gave verbal consent to proceed.  History of Present Illness   The patient, with a history of using the contraceptive implant, Nexplanon, presents for removal and replacement of the device. She has had the current implant for approximately three and a half years. Recently, she has experienced a return of her menstrual cycle, occurring monthly, and associated with significant pain. Prior to this, her periods had ceased completely while on Nexplanon.  This is the patient's third Nexplanon implant, having tried other contraceptive methods such as pills, patches, and the Depo shot, all of which were unsatisfactory. The patient reports that Nexplanon has been the most effective method for her, reducing cramps and mood swings, and usually eliminating her period.  The patient has a history of difficult implant removals, with the previous implant requiring an hour to extract due to its deep placement. The current implant is also noted to be deeply placed. Despite this, the patient reports a high tolerance for pain and no anxiety related to the procedure. She expresses a desire for the procedure to be completed as soon as possible.      Relevant past medical, surgical, family and social history reviewed and updated as indicated. Interim medical history since our last visit reviewed. Allergies and medications reviewed and updated.  ROS per HPI unless specifically indicated above     Objective:    BP 109/73   Pulse 89   Temp 98.6 F (37 C)  (Oral)   Wt (!) 395 lb (179.2 kg)   LMP 02/20/2023 (Approximate)   SpO2 98%   BMI 62.42 kg/m   Wt Readings from Last 3 Encounters:  03/14/23 (!) 395 lb (179.2 kg)  03/13/23 (!) 395 lb 12.8 oz (179.5 kg)  09/28/21 (!) 390 lb (176.9 kg)     Physical Exam Constitutional:      Appearance: Normal appearance.  Pulmonary:     Effort: Pulmonary effort is normal.  Musculoskeletal:        General: Normal range of motion.  Skin:    Comments: Able to easily palpate rod on left arm. Rod is more proximal that expected.  Neurological:     General: No focal deficit present.     Mental Status: She is alert. Mental status is at baseline.  Psychiatric:        Mood and Affect: Mood normal.        Behavior: Behavior normal.        Thought Content: Thought content normal.         03/13/2023    9:37 AM 09/28/2021    8:34 AM 01/29/2021   11:15 AM 06/15/2020   11:24 AM 12/16/2019   11:22 AM  Depression screen PHQ 2/9  Decreased Interest 1 2 2 1 1   Down, Depressed, Hopeless 2 1 0 1 1  PHQ - 2 Score 3 3 2 2 2   Altered sleeping 2 2 2 2  1  Tired, decreased energy 1 1 0 1 2  Change in appetite 1 1 0 2 1  Feeling bad or failure about yourself  1 1 0 1 0  Trouble concentrating 1 1 0 1 1  Moving slowly or fidgety/restless 0 1 0 0 0  Suicidal thoughts 0 0 0 0 0  PHQ-9 Score 9 10 4 9 7   Difficult doing work/chores Somewhat difficult Somewhat difficult Somewhat difficult Somewhat difficult        03/13/2023    9:37 AM 09/28/2021    8:35 AM 01/29/2021   11:15 AM 06/15/2020   11:24 AM  GAD 7 : Generalized Anxiety Score  Nervous, Anxious, on Edge 2 1 2 2   Control/stop worrying 0 0 1 1  Worry too much - different things 1 1 1  0  Trouble relaxing 1 1 1 2   Restless 0 0 0 0  Easily annoyed or irritable 1 1 1 1   Afraid - awful might happen 0 0 0 0  Total GAD 7 Score 5 4 6 6   Anxiety Difficulty Not difficult at all Somewhat difficult Somewhat difficult Somewhat difficult       Assessment &  Plan:  Assessment & Plan   Nexplanon in place No contraindications identified to procedure. Current Nexplanon in place for 3.5 years per patient but on chart review, reported placed in 2019 so it is overdue for replacement (~5 years). Recent return of monthly menses and associated cramping. Patient prefers Nexplanon for contraception and wishes to replace it despite understanding it is approved for use up to 5 years. Reviewed anatomy and confirmed no referral needed at this time. -Remove and replace Nexplanon on 03/21/2023 at 8 AM.  Encounter for behavioral health screening As part of their intake evaluation, the patient was screened for depression, anxiety.  PHQ9 SCORE 9, GAD7 SCORE 5. Screening results positive for tested conditions. Suspect above removal will help. Otherwise f/u with PCP.  Follow up plan: Return in about 1 week (around 03/21/2023) for nexplanon removal and insertion.  Alexandro Line Howell Pringle, MD

## 2023-03-14 NOTE — Progress Notes (Signed)
Contacted via MyChart -- need 6 week lab only visit please

## 2023-03-15 ENCOUNTER — Encounter: Payer: Self-pay | Admitting: Pediatrics

## 2023-03-21 ENCOUNTER — Ambulatory Visit: Payer: Medicaid Other | Admitting: Pediatrics

## 2023-03-21 ENCOUNTER — Encounter: Payer: Self-pay | Admitting: Pediatrics

## 2023-03-21 VITALS — BP 138/80 | HR 87 | Temp 98.7°F | Wt >= 6400 oz

## 2023-03-21 DIAGNOSIS — Z3046 Encounter for surveillance of implantable subdermal contraceptive: Secondary | ICD-10-CM

## 2023-03-21 DIAGNOSIS — Z3202 Encounter for pregnancy test, result negative: Secondary | ICD-10-CM | POA: Diagnosis not present

## 2023-03-21 DIAGNOSIS — Z975 Presence of (intrauterine) contraceptive device: Secondary | ICD-10-CM

## 2023-03-21 LAB — PREGNANCY, URINE: Preg Test, Ur: NEGATIVE

## 2023-03-21 NOTE — Progress Notes (Signed)
Nexplanon Removal Procedure Note  Amy Larsen 42 y.o. female presenting for Nexplanon removal.  Indication: overdue for switch  Preparation:  The risks, benefits, and alternatives to the procedure were discussed in detail and electronic informed consent was obtained. Risks of removal include damage to local structures, bleeding, pain, infection, scarring.   Patient denied allergies to lidocaine or iodine.   Procedure Details:  Distal margin was marked with sterile pen and area cleaned x 3 with alcohol.  0.5 mL of 1% lidocaine with epi was injected at the distal margin of the implant. The area was then cleaned with chlorhexidine x 3.  0.5 cm incision was made with #11 blade. Device gently pushed towards incision until tip visible, used blunt blade tip to remove fibrosis. Implant then presented and was removed with sterile gloves. Implant noted to be fully intact, confirmed to be 4cm.  Gauze applied to removal site and hemostasis easily achieved. Dressing applied.   Complications: none  Post-procedure: standard post procedure care instructions were given to the patient. Patient understands that pregnancy can occur quickly after removal. Alternative contraception method is: reinsertion of nexplanon. Routine health maintenance exams recommended.  Patient to follow-up as needed.   Nexplanon Insertion Procedure Note  Amy Larsen is a 42 y.o. female presenting for Nexplanon Insertion.   Indication: contraception  Preparation:  Urine pregnancy test result: negative  The risks, benefits, and alternatives to the procedure were discussed in detail and electronic informed consent was obtained. Risks of insertion include loss of pregnancy if pregnant, damage to local structures including damage to nerves and blood vessels that may require surgical repair, bleeding and pain during and after the procedure, infection, failure of proper deployment. Risks after placement include pain,  pregnancy (rare), allergic reaction to substances used during insertion. Risks of removal include damage to local structures, bleeding, pain, infection, scarring.   Because removal site from prior nexplanon was in suboptimal area (close to axilla), opted to place new one at a new location as described below.  Procedure Details:  Patient was placed in the supine position with non-dominant arm flexed at the elbow and externally rotated.  Bicipital groove palpated.  Insertion site 8-10 cm proximal to medial epicondyle and 3 cm posterior to bicipital groove was marked with sterile marker.  Area of insertion was cleaned with alcohol x 3 and anesthetized with 4 mL of 1%lidocaine with epi.  The area was then cleaned with chlorhexidine x 2. Visual confirmation of Nexplanon in the applicator device. Nexplanon inserted into the subdermal connective tissue.  Confirmed placement by palpating both ends of implant in the patient's arm.  Pressure bandage with sterile gauze applied to insertion site, followed by application of adhesive bandage.  Patient was taught how to palpate the device.  User card was completed and given to patient.   Complications: none  Lot # : [id number] Exp date: 2026-10 Removal date: 03/20/2028  Post-procedure: standard post procedure care instructions were given to the patient. Patient aware that removal of Nexplanon is due in 5 years. Routine health maintenance exams recommended.  Patient to follow-up as needed.  Nigel Wessman Howell Pringle, MD

## 2023-03-21 NOTE — Patient Instructions (Signed)
   [facility] Address: [address], [place] [address] Phone: [phone]   Nexplanon (progestin implant) take-home sheet  The implant starts working in 7 days to prevent pregnancy. You should use a back-up method for the first 7 days after implant placement unless your period started less than 5 days ago. If your period started less than 5 days ago, the implant starts working right away, and you do not need a backup method.  The implant can remain under your skin for 5 years. It can be taken out at anytime before then if you desire. Removal date: ___12/3/2029_______ (5 years from today)  Things to know: Common side effects include: Irregular bleeding. Your periods may change. You may have more bleeding, less bleeding, or no bleeding, and periods may last longer than usual. Bruising and swelling at site are common in the first 24 hours. Keep the dressing on for 24 hours. After 24 hours, you can remove the dressing and take a shower or bath. You can check the implant by pressing your fingertips over the skin where the implant was inserted. You should feel a small rod. If you do not feel your implant, call your clinician.  You may return to school or work after your visit.    The implant does NOT protect against sexually transmitted infections (STIs). You should use latex condoms and/or dental dams to prevent STIs. Most people should get tested for STIs once a year.   Warning Signs: Within First Week Redness, warmth, or drainage from insertion site Fever (>101 degrees)  At Any Time Feeling pregnant (breast pain, nausea) Positive home pregnancy test  If you develop any of the above warning signs you should be seen by a clinician. You can call us at [phone], or go to your usual clinician.

## 2023-04-25 ENCOUNTER — Other Ambulatory Visit: Payer: Medicaid Other

## 2024-03-29 ENCOUNTER — Ambulatory Visit

## 2024-03-29 ENCOUNTER — Telehealth: Payer: Self-pay | Admitting: Nurse Practitioner

## 2024-03-29 DIAGNOSIS — R7301 Impaired fasting glucose: Secondary | ICD-10-CM | POA: Diagnosis not present

## 2024-03-29 DIAGNOSIS — Z23 Encounter for immunization: Secondary | ICD-10-CM

## 2024-03-29 NOTE — Telephone Encounter (Signed)
 Patient dropped off Accommodation Request Form to be filled out by provider. Patient is requesting a call back at [phone] within 2-5 days when completed. Document is located in providers folder.

## 2024-03-29 NOTE — Progress Notes (Signed)
 Patient is in office today for a nurse visit for Flu Immunization. Patient received injection to the right deltoid. During visit she also received her covid 19 vaccine to the left deltoid. Patient tolerated both well.

## 2024-03-29 NOTE — Telephone Encounter (Signed)
 Patient aware that in order for provider to complete an appointment is required. She has been scheduled for the needed appointment and paperwork is pending in the incomplete folders.

## 2024-04-02 ENCOUNTER — Other Ambulatory Visit: Payer: Self-pay | Admitting: Nurse Practitioner

## 2024-04-05 NOTE — Telephone Encounter (Signed)
 Requested medication (s) are due for refill today: yes  Requested medication (s) are on the active medication list: yes  Last refill:  03/14/23  Future visit scheduled: yes  Notes to clinic:  Unable to refill per protocol due to failed labs, no updated results.      Requested Prescriptions  Pending Prescriptions Disp Refills   levothyroxine  (SYNTHROID ) 100 MCG tablet [Pharmacy Med Name: Levothyroxine  Sodium 100 MCG Oral Tablet] 90 tablet 0    Sig: Take 1 tablet by mouth once daily     Endocrinology:  Hypothyroid Agents Failed - 04/05/2024 10:58 AM      Failed - TSH in normal range and within 360 days    TSH  Date Value Ref Range Status  03/13/2023 4.520 (H) 0.450 - 4.500 uIU/mL Final         Failed - Valid encounter within last 12 months    Recent Outpatient Visits   None

## 2024-04-06 NOTE — Patient Instructions (Signed)

## 2024-04-08 ENCOUNTER — Ambulatory Visit: Admitting: Nurse Practitioner

## 2024-04-08 ENCOUNTER — Encounter: Payer: Self-pay | Admitting: Nurse Practitioner

## 2024-04-08 ENCOUNTER — Other Ambulatory Visit: Payer: Self-pay | Admitting: Nurse Practitioner

## 2024-04-08 VITALS — BP 115/78 | HR 99 | Temp 98.3°F | Resp 18 | Ht 66.69 in | Wt >= 6400 oz

## 2024-04-08 DIAGNOSIS — M5442 Lumbago with sciatica, left side: Secondary | ICD-10-CM | POA: Diagnosis not present

## 2024-04-08 DIAGNOSIS — F4310 Post-traumatic stress disorder, unspecified: Secondary | ICD-10-CM

## 2024-04-08 DIAGNOSIS — G8929 Other chronic pain: Secondary | ICD-10-CM

## 2024-04-08 DIAGNOSIS — E039 Hypothyroidism, unspecified: Secondary | ICD-10-CM | POA: Diagnosis not present

## 2024-04-08 MED ORDER — HYDROCORTISONE 2.5 % EX OINT: TOPICAL_OINTMENT | Freq: Two times a day (BID) | CUTANEOUS | 2 refills | Status: DC

## 2024-04-08 NOTE — Assessment & Plan Note (Signed)
 Chronic, ongoing.  Did not have success with pain clinic in the past.  Would benefit from pain evaluation and recommendations.  At this time continue at home regimen, including CBD. Will complete ADA forms for patient to allow for work accommodations to include sit to stand desk, more frequent bathroom breaks, and appropriate chair for chronic back pain (more supportive).

## 2024-04-08 NOTE — Progress Notes (Signed)
 "  BP 115/78 (BP Location: Right Wrist, Patient Position: Sitting, Cuff Size: Normal)   Pulse 99   Temp 98.3 F (36.8 C) (Oral)   Resp 18   Ht 5' 6.69 (1.694 m)   Wt (!) 429 lb (194.6 kg)   SpO2 99%   BMI 67.81 kg/m    Subjective:    Patient ID: Amy Larsen, female    DOB: 24-May-1980, 43 y.o.   MRN: [id number]  HPI: Amy Larsen is a 43 y.o. female  Chief Complaint  Patient presents with   Paperwork    ADA paperwork for work. Needs a sit to stand desk and extra bathroom breaks and a different office chair. 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    HYPOTHYROIDISM Takes Levothyroxine  100 MCG daily, has been out of this for 2 weeks. Has chronic back pain at baseline, needs a sit to stand desk for her workplace, extra breaks for restroom, and needs a different office chair because current one cause pain to her legs. Paperwork to complete.  She reports current mood is more down as this is time of year her father passed. Thyroid  control status:stable Satisfied with current treatment? yes Medication side effects: no Medication compliance: good compliance Etiology of hypothyroidism:  Recent dose adjustment:no Fatigue: no Cold intolerance: yes Heat intolerance: no Weight gain: no Weight loss: no Constipation: yes, occasional Diarrhea/loose stools: yes, occasional Palpitations: no Lower extremity edema: yes, on occasion.  Wears TED hose daily + avoids salt.   Anxiety/depressed mood: occasional      04/08/2024   11:24 AM 03/13/2023    9:37 AM 09/28/2021    8:34 AM 01/29/2021   11:15 AM 06/15/2020   11:24 AM  Depression screen PHQ 2/9  Decreased Interest 2 1 2 2 1   Down, Depressed, Hopeless 1 2 1  0 1  PHQ - 2 Score 3 3 3 2 2   Altered sleeping 2 2 2 2 2   Tired, decreased energy 2 1 1  0 1  Change in appetite 1 1 1  0 2  Feeling bad or failure about yourself  1 1 1  0 1  Trouble concentrating 1 1 1  0 1  Moving slowly or fidgety/restless 1 0 1 0 0  Suicidal  thoughts 1 0 0 0 0  PHQ-9 Score 12 9  10  4  9    Difficult doing work/chores Somewhat difficult Somewhat difficult Somewhat difficult Somewhat difficult Somewhat difficult     Data saved with a previous flowsheet row definition       04/08/2024   11:24 AM 03/13/2023    9:37 AM 09/28/2021    8:35 AM 01/29/2021   11:15 AM  GAD 7 : Generalized Anxiety Score  Nervous, Anxious, on Edge 2 2 1 2   Control/stop worrying 1 0 0 1  Worry too much - different things 1 1 1 1   Trouble relaxing 1 1 1 1   Restless 1 0 0 0  Easily annoyed or irritable 1 1 1 1   Afraid - awful might happen 0 0 0 0  Total GAD 7 Score 7 5 4 6   Anxiety Difficulty Somewhat difficult Not difficult at all Somewhat difficult Somewhat difficult   Relevant past medical, surgical, family and social history reviewed and updated as indicated. Interim medical history since our last visit reviewed. Allergies and medications reviewed and updated.  Review of Systems  Constitutional:  Negative for activity change, appetite change, diaphoresis, fatigue and fever.  Respiratory:  Negative for cough, chest  tightness and shortness of breath.   Cardiovascular:  Positive for leg swelling (baseline). Negative for chest pain and palpitations.  Endocrine: Negative for cold intolerance and heat intolerance.  Musculoskeletal:  Positive for arthralgias.  Neurological: Negative.   Psychiatric/Behavioral:  Positive for sleep disturbance. Negative for decreased concentration, self-injury and suicidal ideas (denies any plans or ideation). The patient is nervous/anxious.     Per HPI unless specifically indicated above     Objective:    BP 115/78 (BP Location: Right Wrist, Patient Position: Sitting, Cuff Size: Normal)   Pulse 99   Temp 98.3 F (36.8 C) (Oral)   Resp 18   Ht 5' 6.69 (1.694 m)   Wt (!) 429 lb (194.6 kg)   SpO2 99%   BMI 67.81 kg/m   Wt Readings from Last 3 Encounters:  04/08/24 (!) 429 lb (194.6 kg)  03/21/23 (!) 405 lb  6.4 oz (183.9 kg)  03/14/23 (!) 395 lb (179.2 kg)    Physical Exam Vitals and nursing note reviewed.  Constitutional:      General: She is awake. She is not in acute distress.    Appearance: She is well-developed and well-groomed. She is obese. She is not ill-appearing or toxic-appearing.  HENT:     Head: Normocephalic.     Right Ear: Hearing and external ear normal.     Left Ear: Hearing and external ear normal.  Eyes:     General: Lids are normal.        Right eye: No discharge.        Left eye: No discharge.     Conjunctiva/sclera: Conjunctivae normal.     Pupils: Pupils are equal, round, and reactive to light.  Neck:     Thyroid : No thyromegaly.     Vascular: No carotid bruit.  Cardiovascular:     Rate and Rhythm: Normal rate and regular rhythm.     Heart sounds: Normal heart sounds. No murmur heard.    No gallop.  Pulmonary:     Effort: Pulmonary effort is normal. No accessory muscle usage or respiratory distress.     Breath sounds: Normal breath sounds.  Abdominal:     General: Bowel sounds are normal. There is no distension.     Palpations: Abdomen is soft.     Tenderness: There is no abdominal tenderness.  Musculoskeletal:     Cervical back: Normal range of motion and neck supple.     Right lower leg: No edema.     Left lower leg: No edema.  Lymphadenopathy:     Cervical: No cervical adenopathy.  Skin:    General: Skin is warm and dry.  Neurological:     Mental Status: She is alert and oriented to person, place, and time.     Deep Tendon Reflexes: Reflexes are normal and symmetric.     Reflex Scores:      Brachioradialis reflexes are 2+ on the right side and 2+ on the left side.      Patellar reflexes are 2+ on the right side and 2+ on the left side. Psychiatric:        Attention and Perception: Attention normal.        Mood and Affect: Mood normal.        Speech: Speech normal.        Behavior: Behavior normal. Behavior is cooperative.        Thought  Content: Thought content normal.    Results for orders placed or performed in visit  on 03/21/23  Pregnancy, urine   Collection Time: 03/21/23  8:24 AM  Result Value Ref Range   Preg Test, Ur Negative Negative      Assessment & Plan:   Problem List Items Addressed This Visit       Endocrine   Hypothyroidism   Chronic, ongoing.  Continue current medication regimen and adjust as needed.  Thyroid  labs in January.  Will send refills after these are resulted.        Nervous and Auditory   Chronic low back pain with left-sided sciatica - Primary   Chronic, ongoing.  Did not have success with pain clinic in the past.  Would benefit from pain evaluation and recommendations.  At this time continue at home regimen, including CBD. Will complete ADA forms for patient to allow for work accommodations to include sit to stand desk, more frequent bathroom breaks, and appropriate chair for chronic back pain (more supportive).         Other   PTSD (post-traumatic stress disorder)   Chronic, ongoing.  Denies SI/HI. Stable without medication at this time.  Continue to monitor. She does endorse this time of year is more difficult due to past loss of father.        Follow up plan: Return for as scheduled in January.      "

## 2024-04-08 NOTE — Assessment & Plan Note (Signed)
 Chronic, ongoing.  Denies SI/HI. Stable without medication at this time.  Continue to monitor. She does endorse this time of year is more difficult due to past loss of father.

## 2024-04-08 NOTE — Assessment & Plan Note (Signed)
 Chronic, ongoing.  Continue current medication regimen and adjust as needed.  Thyroid  labs in January.  Will send refills after these are resulted.

## 2024-04-22 ENCOUNTER — Encounter: Payer: Self-pay | Admitting: Nurse Practitioner

## 2024-04-27 NOTE — Patient Instructions (Incomplete)
 Be Involved in Caring For Your Health:  Taking Medications When medications are taken as directed, they can greatly improve your health. But if they are not taken as prescribed, they may not work. In some cases, not taking them correctly can be harmful. To help ensure your treatment remains effective and safe, understand your medications and how to take them. Bring your medications to each visit for review by your provider.  Your lab results, notes, and after visit summary will be available on My Chart. We strongly encourage you to use this feature. If lab results are abnormal the clinic will contact you with the appropriate steps. If the clinic does not contact you assume the results are satisfactory. You can always view your results on My Chart. If you have questions regarding your health or results, please contact the clinic during office hours. You can also ask questions on My Chart.  We at [facility] are grateful that you chose [place] to provide your care. We strive to provide evidence-based and compassionate care and are always looking for feedback. If you get a survey from the clinic please complete this so we can hear your opinions.  Prediabetes: Eating Plan Prediabetes is when your levels of blood sugar, also called glucose, are higher than normal. This can put you at risk for getting type 2 diabetes. When you have prediabetes, making healthy changes can help keep you from getting diabetes. This includes changes in your diet. Work with your health care provider or an expert in healthy eating called a dietitian. They can help you create a healthy eating plan. This plan can help you: Control your blood sugar levels. Improve your cholesterol levels. Manage your blood pressure. What are tips for following this plan? Reading food labels Read food labels to check the amount of fat and sugar in prepackaged foods. Avoid foods that have: Saturated fats. Trans fats. Added sugars. Check  food labels for the amount of salt (sodium). Avoid foods that have more than 300 milligrams (mg) of salt per serving. Limit your salt intake to less than 2,300 mg each day. Shopping Avoid buying pre-made and processed foods. Avoid buying drinks with added sugar. Cooking Cook with olive oil. Do not use: Butter. Lard. Ghee. Bake, broil, grill, steam, or boil foods. Avoid frying. Meal planning  Work with your dietitian to create an eating plan that's right for you. This may include tracking how many calories you take in each day. Use a food diary, notebook, or mobile app to track what you eat at each meal. Consider following a Mediterranean diet. This includes: Eating many servings of fresh fruits and vegetables each day. Eating fish at least twice a week. Eating one serving each day of whole grains, beans, nuts, and seeds. Using olive oil instead of other fats. Limiting alcohol. Limiting red meat. Using nonfat or low-fat dairy products. Consider following a plant-based diet. This means eating mostly: Vegetables and fruit. Grains. Beans. Nuts and seeds. If you have high blood pressure, you may need to limit your salt intake or follow a diet called the DASH eating plan. The DASH eating plan can help lower high blood pressure. Lifestyle Set weight loss goals with help from your health care team. Losing 7% of your body weight is a good goal for most people with prediabetes. Exercise for at least 30 minutes, 5 or more days a week. For support, think about joining a support group or talking with a mental health counselor. Take medicines only as  told. What foods are recommended? Fruits Berries. Bananas. Apples. Oranges. Grapes. Papaya. Mango. Pomegranate. Kiwi. Grapefruit. Cherries. Vegetables Lettuce. Spinach. Peas. Beets. Cauliflower. Cabbage. Broccoli. Carrots. Tomatoes. Squash. Eggplant. Herbs. Peppers. Onions. Cucumbers. Brussels sprouts. Grains Whole grains, such as whole-wheat  or whole-grain breads or pasta. Unsweetened oatmeal. Bulgur. Barley. Quinoa. Brown rice. Corn or whole-wheat flour tortillas or taco shells. Meats and other proteins Seafood. Poultry without skin. Lean cuts of pork and beef. Tofu. Eggs. Nuts. Beans. Dairy Low-fat or fat-free dairy products, such as yogurt, cottage cheese, and cheese. Beverages Water. Tea. Coffee. Sugar-free or diet soda. Seltzer water. Low-fat or nonfat milk. Milk alternatives, such as soy or almond milk. Fats and oils Olive oil. Canola oil. Sunflower oil. Grapeseed oil. Avocado. Walnuts. Sweets and desserts Sugar-free or low-fat pudding. Sugar-free or low-fat ice cream and other frozen treats. Seasonings and condiments Herbs. Salt-free spices. Mustard. Relish. Low-salt, low-sugar ketchup. Low-salt, low-sugar barbecue sauce. Low-fat or fat-free mayonnaise. The items listed above may not be all the foods and drinks you can have. Talk with a dietitian to learn more. What foods are not recommended? Fruits Fruits canned with syrup. Vegetables Canned vegetables. Frozen vegetables with butter or cream sauce. Grains Refined white flour and flour products, such as bread, pasta, snack foods, and cereals. Meats and other proteins Fatty cuts of meat. Poultry with skin. Breaded or fried meat. Processed meats. Dairy Full-fat yogurt, cheese, or milk. Beverages Sweetened drinks, such as iced tea and soda. Fats and oils Butter. Lard. Ghee. Sweets and desserts Baked goods, such as cake, cupcakes, pastries, cookies, and cheesecake. Seasonings and condiments Spice mixes with added salt. Ketchup. Barbecue sauce. Mayonnaise. The items listed above may not be all the foods and drinks you should avoid. Talk with a dietitian to learn more. Where to find more information American Diabetes Association: diabetes.org/food-nutrition This information is not intended to replace advice given to you by your health care provider. Make sure you  discuss any questions you have with your health care provider. Document Revised: 11/06/2022 Document Reviewed: 11/06/2022 Elsevier Patient Education  2024 [employer].

## 2024-05-03 ENCOUNTER — Encounter: Admitting: Nurse Practitioner

## 2024-05-03 DIAGNOSIS — F5104 Psychophysiologic insomnia: Secondary | ICD-10-CM

## 2024-05-03 DIAGNOSIS — E039 Hypothyroidism, unspecified: Secondary | ICD-10-CM

## 2024-05-03 DIAGNOSIS — E7800 Pure hypercholesterolemia, unspecified: Secondary | ICD-10-CM

## 2024-05-03 DIAGNOSIS — Z1231 Encounter for screening mammogram for malignant neoplasm of breast: Secondary | ICD-10-CM

## 2024-05-03 DIAGNOSIS — R7301 Impaired fasting glucose: Secondary | ICD-10-CM

## 2024-05-03 DIAGNOSIS — F4310 Post-traumatic stress disorder, unspecified: Secondary | ICD-10-CM

## 2024-05-03 DIAGNOSIS — E559 Vitamin D deficiency, unspecified: Secondary | ICD-10-CM

## 2024-05-03 DIAGNOSIS — Z0000 Encounter for general adult medical examination without abnormal findings: Secondary | ICD-10-CM
